# Patient Record
Sex: Male | Born: 1975 | Race: Black or African American | Hispanic: No | Marital: Single
Health system: Midwestern US, Community
[De-identification: ages and names within clinical notes are randomized; demographics above are authoritative.]

## PROBLEM LIST (undated history)

## (undated) DIAGNOSIS — I10 Essential (primary) hypertension: Secondary | ICD-10-CM

## (undated) DIAGNOSIS — R0602 Shortness of breath: Secondary | ICD-10-CM

## (undated) DIAGNOSIS — K649 Unspecified hemorrhoids: Secondary | ICD-10-CM

## (undated) DIAGNOSIS — K625 Hemorrhage of anus and rectum: Secondary | ICD-10-CM

---

## 2005-01-01 ENCOUNTER — Emergency Department: Payer: Self-pay | Admitting: Emergency Medicine

## 2005-01-12 ENCOUNTER — Emergency Department: Payer: Self-pay | Admitting: Emergency Medicine

## 2006-02-19 ENCOUNTER — Emergency Department: Payer: Self-pay | Admitting: Emergency Medicine

## 2006-10-04 ENCOUNTER — Emergency Department: Payer: Self-pay | Admitting: Emergency Medicine

## 2006-10-04 ENCOUNTER — Other Ambulatory Visit: Payer: Self-pay

## 2007-01-07 ENCOUNTER — Emergency Department: Payer: Self-pay | Admitting: Emergency Medicine

## 2007-01-08 ENCOUNTER — Other Ambulatory Visit: Payer: Self-pay

## 2009-06-20 ENCOUNTER — Emergency Department: Payer: Self-pay | Admitting: Emergency Medicine

## 2009-09-21 ENCOUNTER — Emergency Department: Payer: Self-pay

## 2010-09-15 ENCOUNTER — Inpatient Hospital Stay (HOSPITAL_COMMUNITY)
Admission: EM | Admit: 2010-09-15 | Discharge: 2010-09-17 | Payer: Self-pay | Source: Home / Self Care | Admitting: Emergency Medicine

## 2010-09-15 ENCOUNTER — Ambulatory Visit: Payer: Self-pay | Admitting: Orthopedic Surgery

## 2010-09-17 ENCOUNTER — Encounter: Payer: Self-pay | Admitting: Orthopedic Surgery

## 2010-09-23 ENCOUNTER — Ambulatory Visit: Payer: Self-pay | Admitting: Orthopedic Surgery

## 2010-09-23 DIAGNOSIS — S82843A Displaced bimalleolar fracture of unspecified lower leg, initial encounter for closed fracture: Secondary | ICD-10-CM

## 2010-09-23 DIAGNOSIS — S9306XA Dislocation of unspecified ankle joint, initial encounter: Secondary | ICD-10-CM | POA: Insufficient documentation

## 2010-09-30 ENCOUNTER — Ambulatory Visit: Payer: Self-pay | Admitting: Orthopedic Surgery

## 2010-10-01 ENCOUNTER — Encounter: Payer: Self-pay | Admitting: Orthopedic Surgery

## 2010-10-29 ENCOUNTER — Ambulatory Visit
Admission: RE | Admit: 2010-10-29 | Discharge: 2010-10-29 | Payer: Self-pay | Source: Home / Self Care | Attending: Orthopedic Surgery | Admitting: Orthopedic Surgery

## 2010-11-09 ENCOUNTER — Inpatient Hospital Stay (HOSPITAL_COMMUNITY)
Admission: EM | Admit: 2010-11-09 | Discharge: 2010-11-15 | DRG: 496 | Disposition: A | Discharge status: Home / Self Care | Attending: Orthopedic Surgery | Admitting: Orthopedic Surgery

## 2010-11-09 DIAGNOSIS — B958 Unspecified staphylococcus as the cause of diseases classified elsewhere: Secondary | ICD-10-CM | POA: Diagnosis present

## 2010-11-09 DIAGNOSIS — L02419 Cutaneous abscess of limb, unspecified: Secondary | ICD-10-CM | POA: Diagnosis present

## 2010-11-09 DIAGNOSIS — Y831 Surgical operation with implant of artificial internal device as the cause of abnormal reaction of the patient, or of later complication, without mention of misadventure at the time of the procedure: Secondary | ICD-10-CM | POA: Diagnosis present

## 2010-11-09 DIAGNOSIS — T847XXA Infection and inflammatory reaction due to other internal orthopedic prosthetic devices, implants and grafts, initial encounter: Principal | ICD-10-CM | POA: Diagnosis present

## 2010-11-09 DIAGNOSIS — I10 Essential (primary) hypertension: Secondary | ICD-10-CM | POA: Diagnosis present

## 2010-11-09 LAB — DIFFERENTIAL
Eosinophils Absolute: 0.2 10*3/uL (ref 0.0–0.7)
Eosinophils Relative: 1 % (ref 0–5)
Lymphs Abs: 1.1 10*3/uL (ref 0.7–4.0)
Monocytes Relative: 11 % (ref 3–12)
Neutrophils Relative %: 81 % — ABNORMAL HIGH (ref 43–77)

## 2010-11-09 LAB — CBC
MCH: 28.7 pg (ref 26.0–34.0)
MCV: 82.1 fL (ref 78.0–100.0)
Platelets: 249 10*3/uL (ref 150–400)
RBC: 4.46 MIL/uL (ref 4.22–5.81)

## 2010-11-09 LAB — BASIC METABOLIC PANEL
BUN: 12 mg/dL (ref 6–23)
Chloride: 97 mEq/L (ref 96–112)
Creatinine, Ser: 1.31 mg/dL (ref 0.4–1.5)
Glucose, Bld: 135 mg/dL — ABNORMAL HIGH (ref 70–99)

## 2010-11-10 LAB — DIFFERENTIAL
Eosinophils Relative: 4 % (ref 0–5)
Lymphocytes Relative: 15 % (ref 12–46)
Lymphs Abs: 1.4 10*3/uL (ref 0.7–4.0)
Monocytes Absolute: 1.5 10*3/uL — ABNORMAL HIGH (ref 0.1–1.0)

## 2010-11-10 LAB — CBC
HCT: 31.5 % — ABNORMAL LOW (ref 39.0–52.0)
Hemoglobin: 10.8 g/dL — ABNORMAL LOW (ref 13.0–17.0)
MCHC: 34.3 g/dL (ref 30.0–36.0)
MCV: 82.2 fL (ref 78.0–100.0)
RDW: 14.3 % (ref 11.5–15.5)

## 2010-11-10 LAB — BASIC METABOLIC PANEL
CO2: 27 mEq/L (ref 19–32)
Chloride: 99 mEq/L (ref 96–112)
Glucose, Bld: 128 mg/dL — ABNORMAL HIGH (ref 70–99)
Potassium: 3.4 mEq/L — ABNORMAL LOW (ref 3.5–5.1)
Sodium: 134 mEq/L — ABNORMAL LOW (ref 135–145)

## 2010-11-11 LAB — DIFFERENTIAL
Eosinophils Absolute: 0.5 10*3/uL (ref 0.0–0.7)
Eosinophils Relative: 6 % — ABNORMAL HIGH (ref 0–5)
Lymphocytes Relative: 19 % (ref 12–46)
Lymphs Abs: 1.8 10*3/uL (ref 0.7–4.0)
Monocytes Absolute: 1.2 10*3/uL — ABNORMAL HIGH (ref 0.1–1.0)

## 2010-11-11 LAB — CBC
HCT: 30.7 % — ABNORMAL LOW (ref 39.0–52.0)
MCH: 28.3 pg (ref 26.0–34.0)
MCHC: 33.9 g/dL (ref 30.0–36.0)
MCV: 83.4 fL (ref 78.0–100.0)
Platelets: 251 10*3/uL (ref 150–400)
RDW: 15 % (ref 11.5–15.5)
WBC: 9.5 10*3/uL (ref 4.0–10.5)

## 2010-11-11 LAB — BASIC METABOLIC PANEL
BUN: 9 mg/dL (ref 6–23)
CO2: 25 mEq/L (ref 19–32)
GFR calc non Af Amer: >60 mL/min (ref >60)
Glucose, Bld: 146 mg/dL — ABNORMAL HIGH (ref 70–99)
Potassium: 3.6 mEq/L (ref 3.5–5.1)

## 2010-11-11 NOTE — Op Note (Addendum)
  NAME:  Dennis Sparks, Dennis Sparks               ACCOUNT NO.:  [id number]  MEDICAL RECORD NO.:  [id number]          PATIENT TYPE:  INP  LOCATION:  A309                          FACILITY:  [facility]  PHYSICIAN:  Vickki Hearing, M.D.DATE OF BIRTH:  09-20-1976  DATE OF PROCEDURE:  11/10/2010 DATE OF DISCHARGE:                              OPERATIVE REPORT   A 35 year old male status post open treatment internal fixation left ankle fracture dislocation over 3 months ago was seen in the office, discharge from care who presented back to the emergency room on the 28th with draining purulence from his incision.  X-rays showed the hardware intact, fracture healed.  The patient was scheduled for incision and drainage and hardware removal.  PREOPERATIVE DIAGNOSIS:  Infection, left ankle.  POSTOP DIAGNOSIS:  Infection, left ankle.  PROCEDURE:  Irrigation and debridement, incision and drainage left ankle with hardware removal of the lateral plate and screws.  SURGEON:  Vickki Hearing, M.D.  ASSISTANT:  None.  ANESTHESIA:  General.  OPERATIVE FINDINGS:  Hardware was intact and fracture has healed.  There was extensive edema and serosanguineous fluid in the soft tissues anterior to the fibula, posterior to the fibula along the incision, proximal and distal to the fixation.  Proper site marking was performed.  Chart update was done.  Time-out procedure was executed at the appropriate time.  After general anesthetic, the patient's left foot and ankle was prepped and draped up to the knee, tourniquet was placed on his left thigh prior to this.  After time-out, limb was exsanguinated with a 4-inch Esmarch, tourniquet was elevated to 300 mmHg.  The previous incision was used and was taken.  An incision was made down to bone with a full-thickness flap.  There was no gross purulence but a lot of serosanguineous drainage and swelling of the soft tissues with necrotic tissue and fat necrosis.  The  fracture fixation was intact.  None of the screws were loose.  The fracture was healed.  The plate was removed along with the inner frag screw.  The wound was irrigated with 3 L of saline and then the wound was closed with the 0 Monocryl initially, packed with a 2-inch Kling, and skin sutures were made with #2-0 nylon, far-near, near-far fashion.  Dressings were applied, Ace wrap was applied.  The patient was extubated and taken to recovery room in stable condition.  I have ordered a PICC line for him.  He can start some Lovenox at 8 o'clock in the morning.  He can resume his blood pressure medications and he will need a dressing change under anesthesia, probably Tuesday or Wednesday.  I expect he will need 6 weeks of IV antibiotics.     Vickki Hearing, M.D.     SEH/MEDQ  D:  11/10/2010  T:  11/11/2010  Job:  [id number]  Electronically Signed by Fuller Canada M.D. on 11/11/2010 04:44:00 PM

## 2010-11-11 NOTE — Discharge Summary (Addendum)
Dennis Sparks, Dennis Sparks               ACCOUNT NO.:  [id number]  MEDICAL RECORD NO.:  [id number]          PATIENT TYPE:  INP  LOCATION:  A309                          FACILITY:  [facility]  PHYSICIAN:  Vickki Hearing, M.D.DATE OF BIRTH:  08-Aug-1976  DATE OF ADMISSION:  11/09/2010 DATE OF DISCHARGE:  LH                         DISCHARGE SUMMARY-REFERRING   CHIEF COMPLAINT:  Drainage, left ankle.  HISTORY:  This is a 35 year old male prisoner who was treated for a fracture dislocation of the left ankle with open treatment and internal fixation with a Synthes small fragment plate and screws and medial screw and pin back on September 17, 2010.  He was recently discharged from the office and placed in an air cast and returned to the prison.  He was doing well until this Tuesday when he started having increased pain and swelling, which eventually resulted in drainage from his left ankle presented to the emergency room today.  The temperature 99.5 and purulent drainage from the inferior portion of the left ankle. Radiographs were obtained at that time showed no change of position of the hardware.  There were no radiolucencies.  His most recent x-ray in the office showed that his fracture had healed.  MEDICAL HISTORY:  Has no known drug allergies.  He did take some hydrochlorothiazide for hypertension.  REVIEW OF SYSTEMS:  Negative.  He says that the pain has decreased once the drainage started.  Prior to Tuesday and up until Tuesday, he was ambulating fine with no pain or loss of function.  Currently, has a pulse of 111, respiratory rate of 16, blood pressure 108/69.  He is 98% on room air.  His temperature is 99.5.  His appearance was normal.  He had normal grooming and hygiene.  He had a normal pulse and perfusion to the left lower extremity with swelling up to the lower third of the ankle.  There was no tenderness on the medial side.  The medial incision was healed with no  complications.  The lateral incision was healed as well with a pinpoint area drainage from the inferior portion.  He was awake, alert and oriented x3.  Mood and affect were normal.  He had normal sensation in the left foot.  His upper extremities were normal.  He had swelling again up to the lower third of the tibia area in the soft tissues including the lateral ankle, mild swelling in the foot.  There was palpable expressible purulence from the superior portion of the wound down to the inferior portion of the wound where the area was open.  Open areas less than a centimeter in length and is probably 2-3 mm long.  Ankle joint is painless on range of motion.  Radiographs show healed left ankle fracture with the lateral plate screw, one in a Frag screw and on the medial side is a malleolar screw with a K-wire for rotation.  IMPRESSION:  Left ankle infection, status post open treatment internal fixation.  PLAN:  Incision drainage hardware removal.  I have discussed the procedure with the patient, treatment options and he agrees with the treatment option.  Informed consent has  been completed.  On Sunday, we will irrigate, debride and remove his hardware and probably legal portion of the wound open and packed.     Vickki Hearing, M.D.     SEH/MEDQ  D:  11/09/2010  T:  11/09/2010  Job:  [id number]  Electronically Signed by Fuller Canada M.D. on 11/11/2010 04:43:56 PM

## 2010-11-12 LAB — CBC
Hemoglobin: 11.5 g/dL — ABNORMAL LOW (ref 13.0–17.0)
MCH: 28.4 pg (ref 26.0–34.0)
MCHC: 34.4 g/dL (ref 30.0–36.0)
RDW: 15 % (ref 11.5–15.5)

## 2010-11-12 LAB — VANCOMYCIN, TROUGH: Vancomycin Tr: 35.1 ug/mL (ref 10.0–20.0)

## 2010-11-12 LAB — BASIC METABOLIC PANEL
BUN: 10 mg/dL (ref 6–23)
CO2: 28 mEq/L (ref 19–32)
Calcium: 9.5 mg/dL (ref 8.4–10.5)
Creatinine, Ser: 1.14 mg/dL (ref 0.4–1.5)
GFR calc non Af Amer: >60 mL/min (ref >60)
Glucose, Bld: 96 mg/dL (ref 70–99)

## 2010-11-12 LAB — WOUND CULTURE

## 2010-11-12 LAB — DIFFERENTIAL
Basophils Absolute: 0.1 10*3/uL (ref 0.0–0.1)
Eosinophils Absolute: 0.5 10*3/uL (ref 0.0–0.7)
Eosinophils Relative: 6 % — ABNORMAL HIGH (ref 0–5)
Lymphocytes Relative: 25 % (ref 12–46)
Monocytes Absolute: 1.2 10*3/uL — ABNORMAL HIGH (ref 0.1–1.0)

## 2010-11-12 NOTE — Letter (Signed)
Summary: Patient medication discharge instructions  Patient medication discharge instructions   Imported By: Jacklynn Ganong 09/20/2010 09:03:30  _____________________________________________________________________  External Attachment:    Type:   Image     Comment:   External Document

## 2010-11-13 LAB — CBC
MCH: 28.7 pg (ref 26.0–34.0)
MCHC: 34.7 g/dL (ref 30.0–36.0)
Platelets: 303 10*3/uL (ref 150–400)

## 2010-11-13 LAB — WOUND CULTURE: Gram Stain: NONE SEEN

## 2010-11-13 LAB — DIFFERENTIAL
Basophils Absolute: 0.1 10*3/uL (ref 0.0–0.1)
Basophils Relative: 1 % (ref 0–1)
Eosinophils Absolute: 0.4 10*3/uL (ref 0.0–0.7)
Monocytes Absolute: 1 10*3/uL (ref 0.1–1.0)
Monocytes Relative: 12 % (ref 3–12)
Neutrophils Relative %: 60 % (ref 43–77)

## 2010-11-13 LAB — BASIC METABOLIC PANEL
BUN: 10 mg/dL (ref 6–23)
CO2: 29 mEq/L (ref 19–32)
Calcium: 8.8 mg/dL (ref 8.4–10.5)
Chloride: 102 mEq/L (ref 96–112)
Creatinine, Ser: 1.01 mg/dL (ref 0.4–1.5)
GFR calc Af Amer: >60 mL/min (ref >60)

## 2010-11-14 LAB — DIFFERENTIAL
Basophils Absolute: 0.1 10*3/uL (ref 0.0–0.1)
Eosinophils Absolute: 0.4 10*3/uL (ref 0.0–0.7)
Eosinophils Relative: 5 % (ref 0–5)
Lymphocytes Relative: 25 % (ref 12–46)

## 2010-11-14 LAB — CBC
Hemoglobin: 10.3 g/dL — ABNORMAL LOW (ref 13.0–17.0)
MCHC: 33.8 g/dL (ref 30.0–36.0)
RDW: 15.1 % (ref 11.5–15.5)

## 2010-11-14 LAB — BASIC METABOLIC PANEL
Calcium: 8.6 mg/dL (ref 8.4–10.5)
Creatinine, Ser: 1.14 mg/dL (ref 0.4–1.5)
GFR calc Af Amer: >60 mL/min (ref >60)
GFR calc non Af Amer: >60 mL/min (ref >60)
Sodium: 139 mEq/L (ref 135–145)

## 2010-11-14 LAB — CULTURE, BLOOD (ROUTINE X 2): Culture: NO GROWTH

## 2010-11-14 LAB — VANCOMYCIN, RANDOM: Vancomycin Rm: <5 ug/mL

## 2010-11-14 NOTE — Assessment & Plan Note (Signed)
Summary: 4 WK RE-CK/XRAY LT ANKLE OOP/POST OP 09/15/10/[facility]   Visit Type:  Follow-up  CC:  left ankle fracture.  History of Present Illness: 35 year old male here for a postop visit after LEFT ankle fracture  surgical procedure open treatment internal fixation LEFT ankle with Synthes small fragment set  Date of surgery September 16, 2010  This is postoperative week number 6   WB STATUS: CRUTCHES NON WEIGHT BEARING   Current medication Norco 10 mg one q. 4 p.r.n. for pain and ibuprofen 800 mg q.8 hours. Lisinopril/Hydrochlorothiazide 20/25 mg 1 daily.  Today for xrays OOP.  Complaints: Good.  He continues to improve. His ankle. Incisions look good. His foot looks good. His foot is plantigrade.  Separately identifiable. X-ray report 3 views LEFT ankle. There is a pin and screw on the medial side and a plate and screw construct with interfragmentary screw. Fixation laterally. The ankle mortise has been reduced. The fracture is healing well.  Impression healing ankle fracture, LEFT with internal fixation.     Impression & Recommendations:  Problem # 1:  CLOSED DISLOCATION OF ANKLE (ICD-837.0)  Orders: Post-Op Check (62694) Ankle x-ray complete,  minimum 3 views (85462)  Problem # 2:  CLOSED BIMALLEOLAR FRACTURE (ICD-824.4)  Orders: Post-Op Check (70350) Ankle x-ray complete,  minimum 3 views (09381)  Problem # 3:  AFTERCARE FOLLOWING SURGERY INJURY AND TRAUMA (ICD-V58.43) Assessment: Improved  Orders: Post-Op Check (82993) Ankle x-ray complete,  minimum 3 views (71696)  Patient Instructions: 1)  CONVERT TO AIR CAST X 6 WEEKS THEN REPEAT XRAYS 2)  WEIGHT BEAR AS TOLERATED    Orders Added: 1)  Post-Op Check [99024] 2)  Ankle x-ray complete,  minimum 3 views [78938]

## 2010-11-14 NOTE — Assessment & Plan Note (Signed)
Summary: POST OP 1/OTIF/LT ANKLE FX/PER DR HARRISON/[facility]   Visit Type:  post op  CC:  left ankle fracture.  History of Present Illness: I saw Dennis Sparks in the office today for a followup visit.  He is a 35 years old man with the complaint of:  post op #1, left ankle  DOS 09-16-10. OTIF, left ankle with Synthes small fragment set.  Medications: Norco 10/325 mg 1 q as needed , Ibuprofen 800 mg q 8 hrs , Lisinopril/Hydrochlorothiazide 20/25 mg 1 daily.  Nonweightbearing on crutches.  He c/o constant pain and swelling     Physical Exam  Additional Exam:  The wounds looks good  There is mild swelling of the foot    Impression & Recommendations:  Problem # 1:  CLOSED BIMALLEOLAR FRACTURE (ICD-824.4) Assessment Comment Only  Orders: Post-Op Check (04540) Short Leg Splint (98119)  Problem # 2:  CLOSED DISLOCATION OF ANKLE (ICD-837.0) Assessment: Comment Only  post op stable   Orders: Post-Op Check (14782)  Patient Instructions: 1)  return 09/30/2010 2)  staples out and xrays  3)  then cast   Orders Added: 1)  Post-Op Check [99024] 2)  Short Leg Splint [29515]

## 2010-11-14 NOTE — Op Note (Signed)
  NAME:  Dennis Sparks, Dennis Sparks               ACCOUNT NO.:  [id number]  MEDICAL RECORD NO.:  [id number]          PATIENT TYPE:  INP  LOCATION:  A309                          FACILITY:  [facility]  PHYSICIAN:  Vickki Hearing, M.D.DATE OF BIRTH:  08-16-1976  DATE OF PROCEDURE:  11/13/2010 DATE OF DISCHARGE:                              OPERATIVE REPORT   This is a 35 year old male who had a open treatment internal fixation of the left ankle, did well over 3 months time, was released from care, presented back to the hospital with draining left ankle wound.  He underwent removal of his internal fixation, irrigation and debridement, he comes back today for dressing change under anesthesia.  PREOPERATIVE DIAGNOSIS:  Infection, left ankle.  POSTOPERATIVE DIAGNOSIS:  Infection, left ankle.  PROCEDURE:  Irrigation and dressing change, left ankle.  SURGEON:  Vickki Hearing, MD  ASSISTANTS:  None.  ANESTHESIA:  General.  OPERATIVE FINDINGS:  Clean wound.  No signs of purulent material. Bleeding muscle reacted well to cautery stimulation.  Sutures were removed.  Dressing was removed.  Wound was irrigated with saline, then closed with 0 Monocryl and #2 nylon suture.  The distal portion of the wound was left open and a packing was placed in the wound.  Time-out procedure was completed prior to the prep and drape.  Sterile dressings were applied.  The patient was extubated and taken to recovery room in stable condition.  POSTOP PLAN:  Continue vancomycin.  Cultures have already been taken. Staph aureus oxacillin sensitive was the ID of the bacteria.     Vickki Hearing, M.D.     SEH/MEDQ  D:  11/13/2010  T:  11/14/2010  Job:  [id number]  Electronically Signed by Fuller Canada M.D. on 11/14/2010 11:03:53 AM

## 2010-11-14 NOTE — Assessment & Plan Note (Signed)
Summary: RE-CK LT ANKLE/REM STAPLES+XRAY/POST OP 09/15/10/[facility]   Visit Type:  Follow-up  CC:  post op left ankle.  History of Present Illness: 35 years old male status post open treatment internal fixation LEFT ankle with Synthes small fragment set  Date of surgery September 16, 2010  Current medication Norco 10 mg one q. 4 p.r.n. for pain and ibuprofen 800 mg q.8 hours. Lisinopril/Hydrochlorothiazide 20/25 mg 1 daily.  Today for staple removal, xrays and then cast.  Doing well, no pain, just soreness and stiffness.     Impression & Recommendations:  Problem # 1:  CLOSED DISLOCATION OF ANKLE (ICD-837.0) Assessment Improved  Orders: Post-Op Check (04540)  Problem # 2:  CLOSED BIMALLEOLAR FRACTURE (ICD-824.4) Assessment: Improved  x-rays 3 views LEFT ankle  Lateral plate with interfragmentary screw and medial screw with one pin.  Fracture line not visible.  Mortise intact.  Impression stable internal fixation LEFT ankle  Patient placed in a short leg nonweightbearing cast to return in 4 weeks for repeat films out of plaster  Orders: Post-Op Check (98119) Ankle x-ray complete,  minimum 3 views (14782)  Patient Instructions: 1)  Please schedule a follow-up appointment in 4 weeks. 2)  xrays in 4 wks left ankle ; OOP   Orders Added: 1)  Post-Op Check [99024] 2)  Ankle x-ray complete,  minimum 3 views [73610]

## 2010-11-14 NOTE — Letter (Signed)
Summary: Doctor Order  Doctor Order   Imported By: Jacklynn Ganong 10/01/2010 15:31:52  _____________________________________________________________________  External Attachment:    Type:   Image     Comment:   External Document

## 2010-11-15 ENCOUNTER — Telehealth: Payer: Self-pay | Admitting: Orthopedic Surgery

## 2010-11-15 LAB — ANAEROBIC CULTURE

## 2010-11-15 LAB — CBC
Hemoglobin: 10.7 g/dL — ABNORMAL LOW (ref 13.0–17.0)
MCHC: 33.2 g/dL (ref 30.0–36.0)
RBC: 3.81 MIL/uL — ABNORMAL LOW (ref 4.22–5.81)

## 2010-11-15 LAB — BASIC METABOLIC PANEL
Calcium: 9.4 mg/dL (ref 8.4–10.5)
Creatinine, Ser: 1.19 mg/dL (ref 0.4–1.5)
GFR calc Af Amer: >60 mL/min (ref >60)

## 2010-11-15 LAB — DIFFERENTIAL
Basophils Absolute: 0.1 10*3/uL (ref 0.0–0.1)
Basophils Relative: 1 % (ref 0–1)
Neutro Abs: 5.4 10*3/uL (ref 1.7–7.7)
Neutrophils Relative %: 60 % (ref 43–77)

## 2010-11-21 ENCOUNTER — Ambulatory Visit (INDEPENDENT_AMBULATORY_CARE_PROVIDER_SITE_OTHER): Admitting: Orthopedic Surgery

## 2010-11-21 ENCOUNTER — Encounter: Payer: Self-pay | Admitting: Orthopedic Surgery

## 2010-11-21 DIAGNOSIS — S82843A Displaced bimalleolar fracture of unspecified lower leg, initial encounter for closed fracture: Secondary | ICD-10-CM

## 2010-11-21 DIAGNOSIS — S9306XA Dislocation of unspecified ankle joint, initial encounter: Secondary | ICD-10-CM

## 2010-11-21 DIAGNOSIS — T847XXA Infection and inflammatory reaction due to other internal orthopedic prosthetic devices, implants and grafts, initial encounter: Secondary | ICD-10-CM

## 2010-11-21 DIAGNOSIS — Z4889 Encounter for other specified surgical aftercare: Secondary | ICD-10-CM

## 2010-11-28 NOTE — Assessment & Plan Note (Signed)
Summary: HOSP FOL/UP/POST OP/LT ANKLE SURG 11/10/10/[facility]...   Visit Type:  Follow-up  CC:  post op 1 ankle.  History of Present Illness: I saw Dennis Sparks in the office today for a followup visit.  He is a 35 years old man with the complaint of:  Post op 1 Irrigation DEBRIDMNET and dressing change left ankle.  DOS    11/11/10 Irrigation and Debridement, Incision and drainage with hardware removal of lateral plate and screws and   on 11/13/10 had Irrigation and dressing change.  THIS IS POD 8.  Norco 5 for pain does not help , No Ibuprofen taken. Has been given Vancomycin by PICC line 1250mg  q 12hrs.  HIS COMPLAINTS ARE: DRAINAGE AND  numbness nad burning at the incision.   Today is here for 1st post op dressing change    Pain level is 7 today.  Operative cultures showed staph aureus sensitive to clindamycin, erythromycin, gentamicin, Levaquin, oxacillin.  Penicillin resistant. Sensitive to vancomycin as well as Bactrim.  Sensitive to tetracycline as well as moxifloxacin.  Physical Exam  Additional Exam:   LEFT ankle wound. There proximal sutures in the proximal three fourths of the incision. There is a small shallow 3 x 5 mm depth of approximately 3-4 mm over the   Impression & Recommendations:  Problem # 1:  AFTERCARE FOLLOWING SURGERY INJURY AND TRAUMA (ICD-V58.43) Assessment Comment Only  Orders: Post-Op Check (21308)  Problem # 2:  CLOSED DISLOCATION OF ANKLE (ICD-837.0) Assessment: Comment Only  Orders: Post-Op Check (65784)  Problem # 3:  CLOSED BIMALLEOLAR FRACTURE (ICD-824.4) Assessment: Comment Only  Orders: Post-Op Check (69629)  Problem # 4:  INF&INFLAM-OTH INTRL ORTH DEVICE IMPLANT&GRAFT (BMW-413.24) Assessment: New  Orders: Post-Op Check (40102)  Medications Added to Medication List This Visit: 1)  Norco 7.5-325 Mg Tabs (Hydrocodone-acetaminophen) .Marland Kitchen.. 1 q 4 hrs as needed pain 2)  Ibuprofen 800 Mg Tabs (Ibuprofen) .Marland Kitchen.. 1 by  mouth three times a day  Patient Instructions: 1)  increase pain medication to hydrocodone 7.5 add ibuprofen  2)  continue antibiotics until March 10th  3)  follow up in 2 weeks  4)  facility nurse remove sutures in 5 days on Feb. 14 Prescriptions: IBUPROFEN 800 MG TABS (IBUPROFEN) 1 by mouth three times a day  #90 x 5   Entered and Authorized by:   Fuller Canada MD   Signed by:   Fuller Canada MD on 11/21/2010   Method used:   Print then Give to Patient   RxID:   [id number] NORCO 7.5-325 MG TABS (HYDROCODONE-ACETAMINOPHEN) 1 q 4 hrs as needed pain  #84 x 5   Entered and Authorized by:   Fuller Canada MD   Signed by:   Fuller Canada MD on 11/21/2010   Method used:   Print then Give to Patient   RxID:   [id number]    Orders Added: 1)  Post-Op Check [41660]

## 2010-11-28 NOTE — Progress Notes (Signed)
Summary: found a facility for Dennis Sparks  Phone Note Other Incoming   Summary of Call: Dennis Sparks [phone]) wanted you to know that they have found a facility to take Dennis Sparks.  He will be going to [facility], the number there is  [phone].  Dennis Sparks said to call her if there is any questions. Initial call taken by: Dennis Sparks,  November 15, 2010 9:09 AM

## 2010-11-30 NOTE — Discharge Summary (Signed)
  NAMEKELVEN, Dennis Sparks               ACCOUNT NO.:  [id number]  MEDICAL RECORD NO.:  [id number]          PATIENT TYPE:  INP  LOCATION:  A309                          FACILITY:  [facility]  PHYSICIAN:  Vickki Hearing, M.D.DATE OF BIRTH:  Dec 26, 1975  DATE OF ADMISSION:  11/09/2010 DATE OF DISCHARGE:  02/03/2012LH                         DISCHARGE SUMMARY-REFERRING   ADMITTING DIAGNOSIS:  Infection left ankle.  DISCHARGE DIAGNOSIS:  Infection left ankle.  OPERATION: 1. On November 11, 2010, he underwent irrigation debridement, incision     and drainage left ankle and hardware removal of lateral plate and     screws. 2. On November 14, 2010, he underwent irrigation and dressing change,     left ankle.  SURGEON:  Vickki Hearing, MD  OPERATIVE FINDINGS:  There was significant purulent material in the first operation with intact hardware and fracture healing and the fibular plating and screws were removed.  There appeared to be no bone infection.  On the second procedure, the wound was clean.  There was no sign of purulent material and the muscle was bleeding well with good reaction to cautery.  The patient was started on IV vancomycin and has been on antibiotics now for 6 days and will need 36 additional days of IV vancomycin.  Operative cultures showed staph aureus sensitive to clindamycin, erythromycin, gentamicin, Levaquin, oxacillin.  Penicillin resistant. Sensitive to vancomycin as well as Bactrim.  Sensitive to tetracycline as well as moxifloxacin.  Blood cultures were negative.  Temperature is 98, pulse was 102 varies between 87 and 102, respirations 20, blood pressure 120s/80s at times 100/65, currently 96/61, O2 sat is 97% on room air.  He is currently receiving wet-to-dry dressings.  He is on IV vancomycin again for 36 additional days.  He will need a vancomycin trough once a week and a creatinine level once a week.  These levels can be reported to [phone]  phone number and [phone] fax number.  Follow up appointment will need to be November 21, 2010.  Call [phone], make appointment for that day in the morning.  DISCHARGE INSTRUCTIONS:  No weightbearing on his left leg.  MEDICATIONS: 1. Hydrocodone 5 mg 1-2 tablets q.4 h. p.r.n. pain. 2. His current vancomycin dosage is 1250 mg IV q.24 h. 3. He is on lisinopril 20 mg daily with Prinivil. 4. He is on hydrochlorothiazide 25 mg daily. 5. He is on ibuprofen 800 mg q.8 h. p.r.n. pain, swelling.  DISCHARGE DISPOSITION:  The facility of choice per the prison nurse, Dennis Sparks.  ALLERGIES:  He has no known drug allergies.     Vickki Hearing, M.D.     SEH/MEDQ  D:  11/15/2010  T:  11/15/2010  Job:  [id number]  Electronically Signed by Fuller Canada M.D. on 11/30/2010 10:43:33 AM

## 2010-12-05 ENCOUNTER — Ambulatory Visit (INDEPENDENT_AMBULATORY_CARE_PROVIDER_SITE_OTHER): Admitting: Orthopedic Surgery

## 2010-12-05 ENCOUNTER — Encounter: Payer: Self-pay | Admitting: Orthopedic Surgery

## 2010-12-05 DIAGNOSIS — S9306XA Dislocation of unspecified ankle joint, initial encounter: Secondary | ICD-10-CM

## 2010-12-05 DIAGNOSIS — Z4889 Encounter for other specified surgical aftercare: Secondary | ICD-10-CM

## 2010-12-05 DIAGNOSIS — S82843A Displaced bimalleolar fracture of unspecified lower leg, initial encounter for closed fracture: Secondary | ICD-10-CM

## 2010-12-05 DIAGNOSIS — T847XXA Infection and inflammatory reaction due to other internal orthopedic prosthetic devices, implants and grafts, initial encounter: Secondary | ICD-10-CM

## 2010-12-10 ENCOUNTER — Ambulatory Visit: Payer: Self-pay | Admitting: Orthopedic Surgery

## 2010-12-10 NOTE — Medication Information (Signed)
Summary: Tax adviser   Imported By: Cammie Sickle 12/06/2010 13:55:32  _____________________________________________________________________  External Attachment:    Type:   Image     Comment:   External Document

## 2010-12-10 NOTE — Assessment & Plan Note (Signed)
Summary: 2 WK RE-CK/POST OP RT ANKLE/SURG 11/10/10/Facilty [phone]...   Visit Type:  Follow-up  CC:  post op ankle.  History of Present Illness: I saw Dennis Sparks in the office today for a followup visit.  He is a 35 years old man with the complaint of:  Post op 2 Irrigation DEBRIDMNET and dressing change left ankle.  11/11/10 Irrigation and Debridement, Incision and drainage with hardware removal of lateral plate and screws and   on 11/13/10 had Irrigation and dressing change.  THIS IS POD 24.  Norco 7.5 for pain and Ibuprofen 800mg , does not need pain med anymore, does not help.   Pain level is 0 today without movement, only pain is if he gets area "bumped" or alot of movement of the leg.  Has been itching for 4 days, Benadryl does not help with the iching.  Has been given Vancomycin by PICC line 1250mg  q 12hrs, last day March 10th.  Today is 2 week recheck wound.  Has been having problems at facility that he is at, [[facility]] nurse there has forgot to turn on valve for treatments twice,     Physical Exam  Additional Exam:  he has a linear wound, approximately 6 cm with a depth of about 5 mm. Granulation tissue noted. Range of motion 0-20.     Impression & Recommendations:  Problem # 1:  INF&INFLAM-OTH INTRL ORTH DEVICE IMPLANT&GRAFT (UJW-119.14) Assessment Improved  Orders: Post-Op Check (78295)  Problem # 2:  AFTERCARE FOLLOWING SURGERY INJURY AND TRAUMA (ICD-V58.43) Assessment: Improved  advised that the milligrams of Benadryl prior to vancomycin. 10 mg, prednisone, prior to vancomycin.  Full weightbearing as tolerated and air cast  Orders: Post-Op Check (62130)  Patient Instructions: 1)  MARCH 13 TH F/U  2)  CONTINUE THE VANCOMYCIN  3)  ok to walk in the brace full weight bearing    Orders Added: 1)  Post-Op Check [86578]

## 2010-12-11 ENCOUNTER — Ambulatory Visit: Admitting: Orthopedic Surgery

## 2010-12-17 ENCOUNTER — Encounter (INDEPENDENT_AMBULATORY_CARE_PROVIDER_SITE_OTHER): Payer: Self-pay | Admitting: *Deleted

## 2010-12-17 ENCOUNTER — Telehealth: Payer: Self-pay | Admitting: Orthopedic Surgery

## 2010-12-19 ENCOUNTER — Telehealth: Payer: Self-pay | Admitting: Orthopedic Surgery

## 2010-12-19 NOTE — Letter (Signed)
Summary: Auth appt [facility]  Auth appt [facility]   Imported By: Cammie Sickle 12/09/2010 10:21:44  _____________________________________________________________________  External Attachment:    Type:   Image     Comment:   External Document

## 2010-12-24 ENCOUNTER — Ambulatory Visit: Admitting: Orthopedic Surgery

## 2010-12-24 LAB — DIFFERENTIAL
Eosinophils Absolute: 0 10*3/uL (ref 0.0–0.7)
Lymphocytes Relative: 17 % (ref 12–46)
Lymphs Abs: 1.7 10*3/uL (ref 0.7–4.0)
Monocytes Relative: 7 % (ref 3–12)
Neutrophils Relative %: 76 % (ref 43–77)

## 2010-12-24 LAB — BASIC METABOLIC PANEL
CO2: 27 mEq/L (ref 19–32)
Calcium: 8.8 mg/dL (ref 8.4–10.5)
Chloride: 100 mEq/L (ref 96–112)
GFR calc Af Amer: >60 mL/min (ref >60)
Sodium: 140 mEq/L (ref 135–145)

## 2010-12-24 LAB — CBC
Hemoglobin: 13.8 g/dL (ref 13.0–17.0)
MCH: 29.1 pg (ref 26.0–34.0)
RBC: 4.75 MIL/uL (ref 4.22–5.81)
WBC: 10.1 10*3/uL (ref 4.0–10.5)

## 2010-12-24 LAB — PROTIME-INR: INR: 0.96 (ref 0.00–1.49)

## 2010-12-24 NOTE — Miscellaneous (Signed)
  Clinical Lists Changes 

## 2010-12-24 NOTE — Progress Notes (Signed)
Summary: refused last 7 days of Vancomycin  Phone Note Other Incoming   Summary of Call: Delaney Meigs Misenheimer/RN at [facility] called about Dennis Sparks ([birth year]) Wanted you to be aware that Dennis Sparks refused the last 7 days of his IV Vancomycin.  He was started on Septra by mouth yesterday (12/16/10) His next appointment here is 12/24/10.  Delaney Meigs can be reached at [phone] if you  need to speak with her about this or have any questions. Initial call taken by: Jacklynn Ganong,  December 17, 2010 8:42 AM

## 2010-12-30 ENCOUNTER — Encounter: Payer: Self-pay | Admitting: Gastroenterology

## 2010-12-31 NOTE — Progress Notes (Signed)
Summary: facility representative re-scheduled appointment  Phone Note Other Incoming   Caller: [facility] Summary of Call: Lennox Solders from [facility] facility, ph [phone], called earlier today (10:45am) to relay that they will need to re-schedule patient Dennis Sparks' appt for 12/24/10 due to transportation (shortage of transportation staff).  Re-scheduled for first available appt. Initial call taken by: Cammie Sickle,  December 19, 2010 7:15 PM

## 2011-01-09 ENCOUNTER — Ambulatory Visit (INDEPENDENT_AMBULATORY_CARE_PROVIDER_SITE_OTHER): Admitting: Orthopedic Surgery

## 2011-01-09 DIAGNOSIS — S82843A Displaced bimalleolar fracture of unspecified lower leg, initial encounter for closed fracture: Secondary | ICD-10-CM

## 2011-01-09 MED ORDER — IBUPROFEN 800 MG PO TABS: 800.0000 mg | ORAL_TABLET | Freq: Three times a day (TID) | ORAL | Status: AC | PRN

## 2011-01-09 NOTE — Medication Information (Signed)
Summary: RX Folder  RX Folder   Imported By: Hendricks Limes LPN 81/19/1478 29:56:21  _____________________________________________________________________  External Attachment:    Type:   Image     Comment:   External Document

## 2011-01-09 NOTE — Letter (Signed)
Summary: [facility] Authorizations visits 11/21/10 12/05/10  [facility] Authorizations visits 11/21/10 12/05/10   Imported By: Cammie Sickle 12/30/2010 20:12:31  _____________________________________________________________________  External Attachment:    Type:   Image     Comment:   External Document

## 2011-01-09 NOTE — Progress Notes (Signed)
He says he is doing well  Exam plating with an Aircast minimal pain.  He had some pain over his great toe metatarsophalangeal joint and he has a mild bunion deformity although he says it wasn't like that before he fell.  Don't see any real problem he does have a flexible bunion deformity with some prominence of the metatarsal head  His ankle incision has healed.  He has some swelling which is somewhat caused by the way he is applying his Ace wraps so I gave him a new way to do that  Things have healed he does not need a new appointment after today

## 2011-03-06 ENCOUNTER — Emergency Department: Payer: Self-pay | Admitting: Emergency Medicine

## 2011-03-11 ENCOUNTER — Emergency Department: Payer: Self-pay | Admitting: Emergency Medicine

## 2011-05-08 ENCOUNTER — Emergency Department: Payer: Self-pay

## 2014-10-21 ENCOUNTER — Emergency Department: Payer: Self-pay | Admitting: Emergency Medicine

## 2014-10-21 LAB — CBC
HCT: 40.4 % (ref 40.0–52.0)
HGB: 13.5 g/dL (ref 13.0–18.0)
MCH: 30.7 pg (ref 26.0–34.0)
MCHC: 33.5 g/dL (ref 32.0–36.0)
MCV: 92 fL (ref 80–100)
Platelet: 207 10*3/uL (ref 150–440)
RBC: 4.41 10*6/uL (ref 4.40–5.90)
RDW: 14 % (ref 11.5–14.5)
WBC: 6.3 10*3/uL (ref 3.8–10.6)

## 2014-10-21 LAB — URINALYSIS, COMPLETE
Bacteria: NONE SEEN
Bilirubin,UR: NEGATIVE
Blood: NEGATIVE
Glucose,UR: NEGATIVE mg/dL (ref 0–75)
KETONE: NEGATIVE
LEUKOCYTE ESTERASE: NEGATIVE
NITRITE: NEGATIVE
PH: 7 (ref 4.5–8.0)
PROTEIN: NEGATIVE
Specific Gravity: 1.013 (ref 1.003–1.030)
Squamous Epithelial: 1

## 2014-10-21 LAB — COMPREHENSIVE METABOLIC PANEL
ALBUMIN: 3.6 g/dL (ref 3.4–5.0)
ANION GAP: 8 (ref 7–16)
Alkaline Phosphatase: 60 U/L
BUN: 8 mg/dL (ref 7–18)
Bilirubin,Total: 0.6 mg/dL (ref 0.2–1.0)
CO2: 26 mmol/L (ref 21–32)
CREATININE: 0.97 mg/dL (ref 0.60–1.30)
Calcium, Total: 8.4 mg/dL — ABNORMAL LOW (ref 8.5–10.1)
Chloride: 105 mmol/L (ref 98–107)
EGFR (African American): >60
Glucose: 96 mg/dL (ref 65–99)
OSMOLALITY: 276 (ref 275–301)
POTASSIUM: 3.3 mmol/L — AB (ref 3.5–5.1)
SGOT(AST): 26 U/L (ref 15–37)
SGPT (ALT): 20 U/L
SODIUM: 139 mmol/L (ref 136–145)
Total Protein: 7 g/dL (ref 6.4–8.2)

## 2014-10-21 LAB — TROPONIN I: TROPONIN-I: 0.03 ng/mL

## 2014-10-21 LAB — LIPASE, BLOOD: LIPASE: 225 U/L (ref 73–393)

## 2016-03-05 ENCOUNTER — Inpatient Hospital Stay
Admit: 2016-03-05 | Discharge: 2016-03-05 | Disposition: A | Discharge status: Home / Self Care | Payer: MEDICAID | Attending: Emergency Medicine

## 2016-03-05 ENCOUNTER — Emergency Department: Admit: 2016-03-05 | Payer: MEDICAID | Primary: Nurse Practitioner

## 2016-03-05 DIAGNOSIS — N23 Unspecified renal colic: Secondary | ICD-10-CM

## 2016-03-05 LAB — CBC WITH AUTOMATED DIFF
ABS. BASOPHILS: 0 10*3/uL (ref 0.0–0.06)
ABS. EOSINOPHILS: 0.1 10*3/uL (ref 0.0–0.4)
ABS. LYMPHOCYTES: 2 10*3/uL (ref 0.9–3.6)
ABS. MONOCYTES: 0.4 10*3/uL (ref 0.05–1.2)
ABS. NEUTROPHILS: 2.3 10*3/uL (ref 1.8–8.0)
BASOPHILS: 0 % (ref 0–2)
EOSINOPHILS: 2 % (ref 0–5)
HCT: 42.5 % (ref 36.0–48.0)
HGB: 14.5 g/dL (ref 13.0–16.0)
LYMPHOCYTES: 42 % (ref 21–52)
MCH: 30.6 PG (ref 24.0–34.0)
MCHC: 34.1 g/dL (ref 31.0–37.0)
MCV: 89.7 FL (ref 74.0–97.0)
MONOCYTES: 9 % (ref 3–10)
MPV: 10.5 FL (ref 9.2–11.8)
NEUTROPHILS: 47 % (ref 40–73)
PLATELET: 230 10*3/uL (ref 135–420)
RBC: 4.74 M/uL (ref 4.70–5.50)
RDW: 13.3 % (ref 11.6–14.5)
WBC: 4.9 10*3/uL (ref 4.6–13.2)

## 2016-03-05 LAB — METABOLIC PANEL, BASIC
Anion gap: 9 mmol/L (ref 3.0–18)
BUN/Creatinine ratio: 9 — ABNORMAL LOW (ref 12–20)
BUN: 10 MG/DL (ref 7.0–18)
CO2: 24 mmol/L (ref 21–32)
Calcium: 8.8 MG/DL (ref 8.5–10.1)
Chloride: 106 mmol/L (ref 100–108)
Creatinine: 1.12 MG/DL (ref 0.6–1.3)
GFR est AA: >60 mL/min/{1.73_m2} (ref >60)
GFR est non-AA: >60 mL/min/{1.73_m2} (ref >60)
Glucose: 138 mg/dL — ABNORMAL HIGH (ref 74–99)
Potassium: 3.9 mmol/L (ref 3.5–5.5)
Sodium: 139 mmol/L (ref 136–145)

## 2016-03-05 LAB — URINALYSIS W/ RFLX MICROSCOPIC
Bilirubin: NEGATIVE
Blood: NEGATIVE
Glucose: NEGATIVE mg/dL
Ketone: NEGATIVE mg/dL
Leukocyte Esterase: NEGATIVE
Nitrites: NEGATIVE
Protein: NEGATIVE mg/dL
Specific gravity: 1.02 (ref 1.005–1.030)
Urobilinogen: 1 EU/dL (ref 0.2–1.0)
pH (UA): 7.5 (ref 5.0–8.0)

## 2016-03-05 MED ORDER — ONDANSETRON (PF) 4 MG/2 ML INJECTION
4 mg/2 mL | INTRAMUSCULAR | Status: AC
Start: 2016-03-05 — End: 2016-03-05
  Administered 2016-03-05: 13:00:00 via INTRAVENOUS

## 2016-03-05 MED ORDER — HYDROMORPHONE (PF) 1 MG/ML IJ SOLN
1 mg/mL | Freq: Once | INTRAMUSCULAR | Status: AC
Start: 2016-03-05 — End: 2016-03-05
  Administered 2016-03-05: 13:00:00 via INTRAVENOUS

## 2016-03-05 MED ORDER — SODIUM CHLORIDE 0.9% BOLUS IV
0.9 % | Freq: Once | INTRAVENOUS | Status: AC
Start: 2016-03-05 — End: 2016-03-05
  Administered 2016-03-05: 13:00:00 via INTRAVENOUS

## 2016-03-05 MED ORDER — KETOROLAC TROMETHAMINE 30 MG/ML INJECTION
30 mg/mL (1 mL) | INTRAMUSCULAR | Status: AC
Start: 2016-03-05 — End: 2016-03-05
  Administered 2016-03-05: 13:00:00 via INTRAVENOUS

## 2016-03-05 MED ORDER — HYDROCODONE-ACETAMINOPHEN 5 MG-325 MG TAB
5-325 mg | ORAL_TABLET | ORAL | 0 refills | Status: DC | PRN
Start: 2016-03-05 — End: 2016-03-13

## 2016-03-05 MED FILL — SODIUM CHLORIDE 0.9 % IV: INTRAVENOUS | Qty: 1000

## 2016-03-05 MED FILL — HYDROMORPHONE (PF) 1 MG/ML IJ SOLN: 1 mg/mL | INTRAMUSCULAR | Qty: 1

## 2016-03-05 MED FILL — ONDANSETRON (PF) 4 MG/2 ML INJECTION: 4 mg/2 mL | INTRAMUSCULAR | Qty: 2

## 2016-03-05 MED FILL — KETOROLAC TROMETHAMINE 30 MG/ML INJECTION: 30 mg/mL (1 mL) | INTRAMUSCULAR | Qty: 1

## 2016-03-05 NOTE — ED Provider Notes (Signed)
HPI Comments: 8:54 AM Brandon Alvarado is a 40 y.o. male with a history of HTN an who presents to the emergency department c/o R flank pain onset 4 days ago. The patient explains Saturday when he got off of work he passed a kidney stone while urinating. Pt notes that he has passed kidney stones in the past while living in [place], and was prescribed medications that helped temporarily. Pt reports associated symptoms of hematuria, nausea, and vomiting. Pt admits tobacco usage. No other concerns at this time.       PCP: None      The history is provided by the patient.        Past Medical History:   Diagnosis Date   ??? Hypertension    ??? Kidney stone        History reviewed. No pertinent surgical history.      History reviewed. No pertinent family history.    Social History     Social History   ??? Marital status: SINGLE     Spouse name: N/A   ??? Number of children: N/A   ??? Years of education: N/A     Occupational History   ??? Not on file.     Social History Main Topics   ??? Smoking status: Not on file   ??? Smokeless tobacco: Not on file   ??? Alcohol use Not on file   ??? Drug use: Not on file   ??? Sexual activity: Not on file     Other Topics Concern   ??? Not on file     Social History Narrative   ??? No narrative on file         ALLERGIES: Review of patient's allergies indicates no known allergies.    Review of Systems   Constitutional: Negative for chills, fatigue and fever.   HENT: Negative for congestion, rhinorrhea and sore throat.    Respiratory: Negative for cough and shortness of breath.    Cardiovascular: Negative for chest pain and palpitations.   Gastrointestinal: Positive for nausea and vomiting. Negative for abdominal pain and diarrhea.   Genitourinary: Positive for flank pain (R) and hematuria. Negative for dysuria and urgency.   Musculoskeletal: Negative for myalgias.   Skin: Negative for rash and wound.   Neurological: Negative for dizziness and headaches.   All other systems reviewed and are negative.      Vitals:     03/05/16 0900 03/05/16 0945 03/05/16 1000 03/05/16 1015   BP: (!) 169/118 (!) 169/114 (!) 173/108 (!) 157/109   Pulse:       Resp:       Temp:       SpO2: 100% 99% 98% 99%   Weight:       Height:                Physical Exam   Constitutional: He is oriented to person, place, and time. He appears well-developed and well-nourished. No distress.   Uncomfortable appearing.    HENT:   Head: Normocephalic and atraumatic.   Mouth/Throat: Oropharynx is clear and moist.   Eyes: Conjunctivae and EOM are normal. Pupils are equal, round, and reactive to light. No scleral icterus.   Neck: Normal range of motion. Neck supple.   Cardiovascular: Normal rate, regular rhythm and normal heart sounds.    No murmur heard.  Pulmonary/Chest: Effort normal and breath sounds normal. No respiratory distress.   Abdominal: Soft. Bowel sounds are normal. He exhibits no distension. There is no  tenderness.   No tenderness currently.    Musculoskeletal: He exhibits no edema.   Lymphadenopathy:     He has no cervical adenopathy.   Neurological: He is alert and oriented to person, place, and time. Coordination normal.   Skin: Skin is warm and dry. No rash noted.   Psychiatric: He has a normal mood and affect. His behavior is normal.   Nursing note and vitals reviewed.       MDM  Number of Diagnoses or Management Options  Renal colic:   Diagnosis management comments: r flank pain h/o renal colic ct and ua reviewed pain improved will dc home        Amount and/or Complexity of Data Reviewed  Clinical lab tests: ordered and reviewed  Tests in the radiology section of CPT??: ordered and reviewed      ED Course       Procedures    Vitals:  Patient Vitals for the past 12 hrs:   Temp Pulse Resp BP SpO2   03/05/16 1015 - - - (!) 157/109 99 %   03/05/16 1000 - - - (!) 173/108 98 %   03/05/16 0945 - - - (!) 169/114 99 %   03/05/16 0900 - - - (!) 169/118 100 %   03/05/16 0853 - - - (!) 156/107 -   03/05/16 0839 97.7 ??F (36.5 ??C) 89 18 (!) 165/119 100 %          Medications ordered:   Medications   sodium chloride 0.9 % bolus infusion 1,000 mL (1,000 mL IntraVENous New Bag 03/05/16 0911)   ketorolac (TORADOL) injection 30 mg (30 mg IntraVENous Given 03/05/16 0911)   HYDROmorphone (PF) (DILAUDID) injection 1 mg (1 mg IntraVENous Given 03/05/16 0911)   ondansetron (ZOFRAN) injection 4 mg (4 mg IntraVENous Given 03/05/16 0911)         Lab findings:  Recent Results (from the past 12 hour(s))   CBC WITH AUTOMATED DIFF    Collection Time: 03/05/16  9:00 AM   Result Value Ref Range    WBC 4.9 4.6 - 13.2 K/uL    RBC 4.74 4.70 - 5.50 M/uL    HGB 14.5 13.0 - 16.0 g/dL    HCT 82.942.5 56.236.0 - 13.048.0 %    MCV 89.7 74.0 - 97.0 FL    MCH 30.6 24.0 - 34.0 PG    MCHC 34.1 31.0 - 37.0 g/dL    RDW 86.513.3 78.411.6 - 69.614.5 %    PLATELET 230 135 - 420 K/uL    MPV 10.5 9.2 - 11.8 FL    NEUTROPHILS 47 40 - 73 %    LYMPHOCYTES 42 21 - 52 %    MONOCYTES 9 3 - 10 %    EOSINOPHILS 2 0 - 5 %    BASOPHILS 0 0 - 2 %    ABS. NEUTROPHILS 2.3 1.8 - 8.0 K/UL    ABS. LYMPHOCYTES 2.0 0.9 - 3.6 K/UL    ABS. MONOCYTES 0.4 0.05 - 1.2 K/UL    ABS. EOSINOPHILS 0.1 0.0 - 0.4 K/UL    ABS. BASOPHILS 0.0 0.0 - 0.06 K/UL    DF AUTOMATED     METABOLIC PANEL, BASIC    Collection Time: 03/05/16  9:00 AM   Result Value Ref Range    Sodium 139 136 - 145 mmol/L    Potassium 3.9 3.5 - 5.5 mmol/L    Chloride 106 100 - 108 mmol/L    CO2 24 21 - 32 mmol/L  Anion gap 9 3.0 - 18 mmol/L    Glucose 138 (H) 74 - 99 mg/dL    BUN 10 7.0 - 18 MG/DL    Creatinine 1.61 0.6 - 1.3 MG/DL    BUN/Creatinine ratio 9 (L) 12 - 20      GFR est AA >60 >60 ml/min/1.25m2    GFR est non-AA >60 >60 ml/min/1.51m2    Calcium 8.8 8.5 - 10.1 MG/DL   URINALYSIS W/ RFLX MICROSCOPIC    Collection Time: 03/05/16 10:30 AM   Result Value Ref Range    Color YELLOW      Appearance CLEAR      Specific gravity 1.020 1.005 - 1.030      pH (UA) 7.5 5.0 - 8.0      Protein NEGATIVE  NEG mg/dL    Glucose NEGATIVE  NEG mg/dL    Ketone NEGATIVE  NEG mg/dL    Bilirubin NEGATIVE  NEG       Blood NEGATIVE  NEG      Urobilinogen 1.0 0.2 - 1.0 EU/dL    Nitrites NEGATIVE  NEG      Leukocyte Esterase NEGATIVE  NEG               X-Ray, CT or other radiology findings or impressions:  CT ABD PELV WO CONT    (Results )  IMPRESSION:  ??  ??  1. No renal or ureteral calculi. No signs of obstructive uropathy.  2. Normal appendix.  3. Mild intramural fat right colon around to the mid transverse colon, normal  finding versus chronic sequela of inflammatory bowel disease. No evidence of  acute bowel inflammation or obstruction.  4. Questionable wall thickening of the urinary bladder raising the possibility  of cystitis.       Orders:  Orders Placed This Encounter   ??? CT ABD PELV WO CONT     Standing Status:   Standing     Number of Occurrences:   1     Order Specific Question:   Transport     Answer:   Doctor, general practice [5]     Order Specific Question:   Is Patient Allergic to Contrast Dye?     Answer:   No   ??? URINALYSIS W/ RFLX MICROSCOPIC     Standing Status:   Standing     Number of Occurrences:   1   ??? CBC WITH AUTOMATED DIFF     Standing Status:   Standing     Number of Occurrences:   1   ??? METABOLIC PANEL, BASIC     Standing Status:   Standing     Number of Occurrences:   1   ??? sodium chloride 0.9 % bolus infusion 1,000 mL   ??? ketorolac (TORADOL) injection 30 mg   ??? HYDROmorphone (PF) (DILAUDID) injection 1 mg   ??? ondansetron (ZOFRAN) injection 4 mg   ??? HYDROcodone-acetaminophen (NORCO) 5-325 mg per tablet     Sig: Take 1 Tab by mouth every four (4) hours as needed for Pain. Max Daily Amount: 6 Tabs.     Dispense:  6 Tab     Refill:  0   ??? LIFE COACH CONSULT     Standing Status:   Standing     Number of Occurrences:   1     Order Specific Question:   Reason for Consult:     Answer:   medical follow up       Progress notes, Consult notes, or  additional Procedure notes:       Disposition:  Diagnosis:   1. Renal colic        Disposition:     Follow-up Information     Follow up With Details Comments Contact Info     Jenene Slicker, MD On 03/13/2016 at 1:30PM check in at 1PM [address]  [address]  [place] [address]  [phone]      [facility] EMERGENCY DEPT  As needed, If symptoms worsen [address]  [place] [address]  [phone]           Patient's Medications   Start Taking    HYDROCODONE-ACETAMINOPHEN (NORCO) 5-325 MG PER TABLET    Take 1 Tab by mouth every four (4) hours as needed for Pain. Max Daily Amount: 6 Tabs.   Continue Taking    No medications on file   These Medications have changed    No medications on file   Stop Taking    No medications on file             Scribe Attestation  Leslie Andrea scribing for and in presence of Burtis Junes, MD (03/05/16)      Physician Attestation  I personally preformed the services described in this documentation, reviewed and edited the documentation which was dictated to the scribe in my presence, and it accurately records my words and actions.     Burtis Junes, MD (03/05/16)      Signed by: Leslie Andrea, Scribe, 03/05/16, 8:54 AM

## 2016-03-05 NOTE — Progress Notes (Signed)
Referral received to assist pt with PCP follow up. Met with pt and agreed for me to assist. Appointment scheduled with Dr Murlean Hark on 03/13/16 at 1330 check in at 1300. Appointment card, [facility] contact #, [facility] and prescription discount card given to pt.

## 2016-03-05 NOTE — ED Notes (Signed)
Pt being discharged home. Discharge instructions given, and pt expresses understanding. Discontinued IV and helped patient to gather belongings. Pt discharged with family member. Prescription given for norco.

## 2016-03-05 NOTE — ED Triage Notes (Signed)
"  I have pain in my right lower back. I've had kidney stones."

## 2016-03-13 ENCOUNTER — Inpatient Hospital Stay: Admit: 2016-03-13 | Payer: MEDICAID | Primary: Nurse Practitioner

## 2016-03-13 ENCOUNTER — Ambulatory Visit
Admit: 2016-03-13 | Discharge: 2016-03-13 | Payer: PRIVATE HEALTH INSURANCE | Attending: Internal Medicine | Primary: Nurse Practitioner

## 2016-03-13 DIAGNOSIS — M549 Dorsalgia, unspecified: Secondary | ICD-10-CM

## 2016-03-13 DIAGNOSIS — I10 Essential (primary) hypertension: Secondary | ICD-10-CM

## 2016-03-13 DIAGNOSIS — M47814 Spondylosis without myelopathy or radiculopathy, thoracic region: Secondary | ICD-10-CM

## 2016-03-13 LAB — C REACTIVE PROTEIN, QT: C-Reactive protein: 0.6 mg/dL — ABNORMAL HIGH (ref 0–0.3)

## 2016-03-13 MED ORDER — OXYCODONE-ACETAMINOPHEN 5 MG-325 MG TAB
5-325 mg | ORAL_TABLET | Freq: Three times a day (TID) | ORAL | 0 refills | Status: DC | PRN
Start: 2016-03-13 — End: 2016-03-27

## 2016-03-13 MED ORDER — HYDROCHLOROTHIAZIDE 25 MG TAB
25 mg | ORAL_TABLET | Freq: Every day | ORAL | 3 refills | Status: DC
Start: 2016-03-13 — End: 2016-05-26

## 2016-03-13 MED ORDER — CYCLOBENZAPRINE 10 MG TAB
10 mg | ORAL_TABLET | Freq: Three times a day (TID) | ORAL | 1 refills | Status: DC | PRN
Start: 2016-03-13 — End: 2016-06-26

## 2016-03-13 MED ORDER — LISINOPRIL 10 MG TAB
10 mg | ORAL_TABLET | Freq: Every day | ORAL | 3 refills | Status: DC
Start: 2016-03-13 — End: 2016-03-27

## 2016-03-13 NOTE — Progress Notes (Signed)
Brandon Alvarado is a 40 y.o.  male and presents with     Chief Complaint   Patient presents with   ??? Hypertension   ??? Back Pain   ??? Kidney Stone       Pt is originally from [place]. 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  Pt says he has h/o kidney stones. Pt went to ER with back pain that started a month ago. Since pt has h/o kidney stones- CT scan was done - that did not show any stones. But is it did mention possibility of inflammation in the colon.  Pt went to [facility] in December with rectal bleeding. CT abdomen at that time was unremarkable.  Pt says his bac hurts and the medicien is not helping him.  Pt is not sure if he pulled a muscle.  Pt also has h/o HTN. He ran out of meds 2 months back.    Past Medical History:   Diagnosis Date   ??? Hypertension    ??? Kidney stone      History reviewed. No pertinent surgical history.  Current Outpatient Prescriptions   Medication Sig   ??? hydroCHLOROthiazide (HYDRODIURIL) 25 mg tablet Take 1 Tab by mouth daily.   ??? lisinopril (PRINIVIL, ZESTRIL) 10 mg tablet Take 1 Tab by mouth daily.   ??? cyclobenzaprine (FLEXERIL) 10 mg tablet Take 1 Tab by mouth three (3) times daily as needed for Muscle Spasm(s).   ??? oxyCODONE-acetaminophen (PERCOCET) 5-325 mg per tablet Take 1 Tab by mouth every eight (8) hours as needed for Pain. Max Daily Amount: 3 Tabs.     No current facility-administered medications for this visit.      Health Maintenance   Topic Date Due   ??? DTaP/Tdap/Td series (1 - Tdap) 09/15/1996   ??? INFLUENZA AGE 71 TO ADULT  05/13/2016       There is no immunization history on file for this patient.  No LMP for male patient.        Allergies and Intolerances:   No Known Allergies    Family History:   History reviewed. No pertinent family history.    Social History:   He  reports that he has been smoking Cigarettes.  He has never used smokeless tobacco.  He  reports that he does not drink alcohol.            Review of Systems:    General: negative for - chills, fatigue, fever, weight change  Psych: negative for - anxiety, depression, irritability or mood swings  ENT: negative for - headaches, hearing change, nasal congestion, oral lesions, sneezing or sore throat  Heme/ Lymph: negative for - bleeding problems, bruising, pallor or swollen lymph nodes  Endo: negative for - hot flashes, polydipsia/polyuria or temperature intolerance  Resp: negative for - cough, shortness of breath or wheezing  CV: negative for - chest pain, edema or palpitations  GI: negative for - abdominal pain, change in bowel habits, constipation, diarrhea or nausea/vomiting  GU: negative for - dysuria, hematuria, incontinence, pelvic pain or vulvar/vaginal symptoms  MSK: negative for - joint pain, joint swelling or muscle pain, pos for back pain  Neuro: negative for - confusion, headaches, seizures or weakness  Derm: negative for - dry skin, hair changes, rash or skin lesion changes          Physical:   Vitals:   Vitals:    03/13/16 1400   BP: (!) 175/111  Pulse: 99   Resp: 18   Temp: 97.6 ??F (36.4 ??C)   TempSrc: Oral   SpO2: 97%   Weight: 214 lb (97.1 kg)   Height: _0  (1.651 m)           Exam:   HEENT- atraumatic,normocephalic, awake, oriented, well nourished  Neck - supple,no enlarged lymph nodes, no JVD, no thyromegaly  Chest- CTA, no rhonchi, no crackles  Heart- rrr, no murmurs / gallop/rub  Abdomen- soft,BS+,NT, no hepatosplenomegaly  Ext - no c/c/edema   Neuro- no focal deficits.Power 5/5 all extremities  Skin - warm,dry, no obvious rashes.          Review of Data:   LABS:   Lab Results   Component Value Date/Time    WBC 4.9 03/05/2016 09:00 AM    HGB 14.5 03/05/2016 09:00 AM    HCT 42.5 03/05/2016 09:00 AM    PLATELET 230 03/05/2016 09:00 AM     Lab Results   Component Value Date/Time    Sodium 139 03/05/2016 09:00 AM    Potassium 3.9 03/05/2016 09:00 AM    Chloride 106 03/05/2016 09:00 AM    CO2 24 03/05/2016 09:00 AM    Glucose 138 03/05/2016 09:00 AM     BUN 10 03/05/2016 09:00 AM    Creatinine 1.12 03/05/2016 09:00 AM     No results found for: CHOL, CHOLX, CHLST, CHOLV, HDL, LDL, DLDL, LDLC, DLDLP, TGL, TGLX, TRIGL, TRIGP  No results found for: GPT        Impression / Plan:        ICD-10-CM ICD-9-CM    1. Essential hypertension I10 401.9 hydroCHLOROthiazide (HYDRODIURIL) 25 mg tablet      lisinopril (PRINIVIL, ZESTRIL) 10 mg tablet   2. Rectal bleeding K62.5 569.3 REFERRAL TO GENERAL SURGERY   3. IFG (impaired fasting glucose) R73.01 790.21 AMB POC HEMOGLOBIN A1C   4. Acute midline back pain, unspecified back location M54.9 724.5 XR SPINE LUMB 2 OR 3 V      [id number]      SED RATE (ESR)      C REACTIVE PROTEIN, QT      cyclobenzaprine (FLEXERIL) 10 mg tablet      oxyCODONE-acetaminophen (PERCOCET) 5-325 mg per tablet   5. Inguinal hernia of left side without obstruction or gangrene K40.90 550.90      PMP reviewed.        Explained to patient risk benefits of the medications.Advised patient to stop meds if having any side effects.Pt verbalized understanding of the instructions.    I have discussed the diagnosis with the patient and the intended plan as seen in the above orders.  The patient has received an after-visit summary and questions were answered concerning future plans.  I have discussed medication side effects and warnings with the patient as well. I have reviewed the plan of care with the patient, accepted their input and they are in agreement with the treatment goals.     Reviewed plan of care. Patient has provided input and agrees with goals.    Follow-up Disposition: Not on File    Elnora Morrison, MD

## 2016-03-14 LAB — SED RATE (ESR): Sed rate, automated: 5 mm/hr (ref 0–15)

## 2016-03-18 LAB — HLA-B27
HLA-B27: NEGATIVE
HLA-B27: NEGATIVE

## 2016-03-18 NOTE — Telephone Encounter (Signed)
Please result patient labs.

## 2016-03-18 NOTE — Telephone Encounter (Signed)
Pt calling to obtain lab results. Please assist.

## 2016-03-19 NOTE — Telephone Encounter (Signed)
Labs are normal.  Xray shows - T11-T12 degenerative  discogenic changes.

## 2016-03-19 NOTE — Telephone Encounter (Signed)
Spoke with patient, pt verbalized understanding, this encounter will be closed.

## 2016-03-27 ENCOUNTER — Ambulatory Visit
Admit: 2016-03-27 | Discharge: 2016-03-27 | Payer: PRIVATE HEALTH INSURANCE | Attending: Internal Medicine | Primary: Nurse Practitioner

## 2016-03-27 DIAGNOSIS — G8929 Other chronic pain: Secondary | ICD-10-CM

## 2016-03-27 LAB — AMB POC HEMOGLOBIN A1C: Hemoglobin A1c (POC): 5.8 %

## 2016-03-27 MED ORDER — OXYCODONE-ACETAMINOPHEN 5 MG-325 MG TAB
5-325 mg | ORAL_TABLET | Freq: Three times a day (TID) | ORAL | 0 refills | Status: DC | PRN
Start: 2016-03-27 — End: 2016-06-26

## 2016-03-27 MED ORDER — LISINOPRIL 20 MG TAB
20 mg | ORAL_TABLET | Freq: Two times a day (BID) | ORAL | 3 refills | Status: DC
Start: 2016-03-27 — End: 2016-05-26

## 2016-03-27 NOTE — Progress Notes (Signed)
Brandon Alvarado is a 40 y.o.  male and presents with     Chief Complaint   Patient presents with   ??? Hypertension   ??? Back Pain   ??? Rectal Bleeding       Pt has back pain and also has rectal bleeding. Pt does have apt with D Derrill Memohong lee.  Pt is taking his bllod pressure meds as directed. HTN runs in his family.  No cancer in family.  Pt deneis constipation.      Past Medical History:   Diagnosis Date   ??? Hypertension    ??? Kidney stone      No past surgical history on file.  Current Outpatient Prescriptions   Medication Sig   ??? lisinopril (PRINIVIL, ZESTRIL) 20 mg tablet Take 1 Tab by mouth two (2) times a day.   ??? oxyCODONE-acetaminophen (PERCOCET) 5-325 mg per tablet Take 1 Tab by mouth every eight (8) hours as needed for Pain. Max Daily Amount: 3 Tabs.   ??? hydroCHLOROthiazide (HYDRODIURIL) 25 mg tablet Take 1 Tab by mouth daily.   ??? cyclobenzaprine (FLEXERIL) 10 mg tablet Take 1 Tab by mouth three (3) times daily as needed for Muscle Spasm(s).     No current facility-administered medications for this visit.      Health Maintenance   Topic Date Due   ??? Pneumococcal 19-64 Medium Risk (1 of 1 - PPSV23) 09/15/1994   ??? DTaP/Tdap/Td series (1 - Tdap) 09/15/1996   ??? INFLUENZA AGE 89 TO ADULT  05/13/2016       There is no immunization history on file for this patient.  No LMP for male patient.        Allergies and Intolerances:   No Known Allergies    Family History:   No family history on file.    Social History:   He  reports that he has been smoking Cigarettes.  He has never used smokeless tobacco.  He  reports that he does not drink alcohol.            Review of Systems:   General: negative for - chills, fatigue, fever, weight change  Psych: negative for - anxiety, depression, irritability or mood swings  ENT: negative for - headaches, hearing change, nasal congestion, oral lesions, sneezing or sore throat  Heme/ Lymph: negative for - bleeding problems, bruising, pallor or swollen lymph nodes   Endo: negative for - hot flashes, polydipsia/polyuria or temperature intolerance  Resp: negative for - cough, shortness of breath or wheezing  CV: negative for - chest pain, edema or palpitations  GI: negative for - abdominal pain, change in bowel habits, constipation, diarrhea or nausea/vomiting,pso for rectal bleeding  GU: negative for - dysuria, hematuria, incontinence, pelvic pain or vulvar/vaginal symptoms  MSK: negative for - joint pain, joint swelling or muscle pain, pos for back pain  Neuro: negative for - confusion, headaches, seizures or weakness  Derm: negative for - dry skin, hair changes, rash or skin lesion changes          Physical:   Vitals:   Vitals:    03/27/16 1015   BP: (!) 152/117   Pulse: (!) 106   Temp: 98.1 ??F (36.7 ??C)   TempSrc: Oral   SpO2: 95%   Weight: 215 lb (97.5 kg)   Height: 5\' 5"  (1.651 m)           Exam:   HEENT- atraumatic,normocephalic, awake, oriented, well nourished  Neck - supple,no enlarged lymph nodes, no  JVD, no thyromegaly  Chest- CTA, no rhonchi, no crackles  Heart- rrr, no murmurs / gallop/rub  Abdomen- soft,BS+,NT, no hepatosplenomegaly  Ext - no c/c/edema   Neuro- no focal deficits.Power 5/5 all extremities  Skin - warm,dry, no obvious rashes.          Review of Data:   LABS:   Lab Results   Component Value Date/Time    WBC 4.9 03/05/2016 09:00 AM    HGB 14.5 03/05/2016 09:00 AM    HCT 42.5 03/05/2016 09:00 AM    PLATELET 230 03/05/2016 09:00 AM     Lab Results   Component Value Date/Time    Sodium 139 03/05/2016 09:00 AM    Potassium 3.9 03/05/2016 09:00 AM    Chloride 106 03/05/2016 09:00 AM    CO2 24 03/05/2016 09:00 AM    Glucose 138 03/05/2016 09:00 AM    BUN 10 03/05/2016 09:00 AM    Creatinine 1.12 03/05/2016 09:00 AM     No results found for: CHOL, CHOLX, CHLST, CHOLV, HDL, LDL, LDLC, DLDLP, TGLX, TRIGL, TRIGP  No results found for: GPT        Impression / Plan:        ICD-10-CM ICD-9-CM    1. Chronic bilateral back pain, unspecified back location M54.9 724.5  REFERRAL TO ORTHOPEDICS    G89.29 338.29    2. Essential hypertension I10 401.9 lisinopril (PRINIVIL, ZESTRIL) 20 mg tablet   3. Acute midline back pain, unspecified back location M54.9 724.5 oxyCODONE-acetaminophen (PERCOCET) 5-325 mg per tablet     Keep appt with [facility] rectal surg.      Explained to patient risk benefits of the medications.Advised patient to stop meds if having any side effects.Pt verbalized understanding of the instructions.    I have discussed the diagnosis with the patient and the intended plan as seen in the above orders.  The patient has received an after-visit summary and questions were answered concerning future plans.  I have discussed medication side effects and warnings with the patient as well. I have reviewed the plan of care with the patient, accepted their input and they are in agreement with the treatment goals.     Reviewed plan of care. Patient has provided input and agrees with goals.    Follow-up Disposition: Not on File    Jenene Slicker, MD

## 2016-03-27 NOTE — Progress Notes (Signed)
1. Have you been to the ER, urgent care clinic since your last visit?  Hospitalized since your last visit?No    2. Have you seen or consulted any other health care providers outside of the [facility] since your last visit?  Include any pap smears or colon screening. No

## 2016-04-22 ENCOUNTER — Encounter: Admit: 2016-04-22 | Discharge: 2016-04-22 | Attending: Surgery | Primary: Nurse Practitioner

## 2016-04-30 ENCOUNTER — Encounter: Attending: Surgery | Primary: Nurse Practitioner

## 2016-05-26 ENCOUNTER — Ambulatory Visit
Admit: 2016-05-26 | Discharge: 2016-05-26 | Payer: PRIVATE HEALTH INSURANCE | Attending: Internal Medicine | Primary: Nurse Practitioner

## 2016-05-26 DIAGNOSIS — G8929 Other chronic pain: Secondary | ICD-10-CM

## 2016-05-26 MED ORDER — LOSARTAN 50 MG TAB
50 mg | ORAL_TABLET | Freq: Two times a day (BID) | ORAL | 3 refills | Status: DC
Start: 2016-05-26 — End: 2016-11-20

## 2016-05-26 MED ORDER — HYDROCHLOROTHIAZIDE 25 MG TAB
25 mg | ORAL_TABLET | Freq: Every day | ORAL | 3 refills | Status: DC
Start: 2016-05-26 — End: 2017-12-07

## 2016-05-26 MED ORDER — METOPROLOL SUCCINATE SR 50 MG 24 HR TAB
50 mg | ORAL_TABLET | Freq: Every day | ORAL | 3 refills | Status: DC
Start: 2016-05-26 — End: 2016-06-12

## 2016-05-26 NOTE — Progress Notes (Signed)
1. Have you been to the ER, urgent care clinic since your last visit?  Hospitalized since your last visit?No    2. Have you seen or consulted any other health care providers outside of the [facility] since your last visit?  Include any pap smears or colon screening. No

## 2016-05-26 NOTE — Progress Notes (Signed)
Brandon Alvarado is a 40 y.o.  male and presents with     Chief Complaint   Patient presents with   ??? Hypertension   ??? Back Pain   ??? Gas   ??? Sore Throat       Pt has been having back pain. Pt says it radiates down both legs.  Pt also gives h/ o left ankle injury and has metal in the left foot. He is wondering if it needs to come out.  Pt also feels itching to the back of his throat.  Pt feels bloated and  Does not feel like eating.Although he has not lost any weight.  Pt does have h/o kidney stones. Sis ter has Crohns.    Past Medical History:   Diagnosis Date   ??? Hypertension    ??? Kidney stone      No past surgical history on file.  Current Outpatient Prescriptions   Medication Sig   ??? hydroCHLOROthiazide (HYDRODIURIL) 25 mg tablet Take 1 Tab by mouth daily.   ??? losartan (COZAAR) 50 mg tablet Take 1 Tab by mouth two (2) times a day.   ??? metoprolol succinate (TOPROL-XL) 50 mg XL tablet Take 1 Tab by mouth daily.   ??? oxyCODONE-acetaminophen (PERCOCET) 5-325 mg per tablet Take 1 Tab by mouth every eight (8) hours as needed for Pain. Max Daily Amount: 3 Tabs.   ??? cyclobenzaprine (FLEXERIL) 10 mg tablet Take 1 Tab by mouth three (3) times daily as needed for Muscle Spasm(s).     No current facility-administered medications for this visit.      Health Maintenance   Topic Date Due   ??? Pneumococcal 19-64 Medium Risk (1 of 1 - PPSV23) 09/15/1994   ??? DTaP/Tdap/Td series (1 - Tdap) 09/15/1996   ??? INFLUENZA AGE 75 TO ADULT  05/13/2016       There is no immunization history on file for this patient.  No LMP for male patient.        Allergies and Intolerances:   Allergies   Allergen Reactions   ??? Lisinopril Other (comments)     Sore throat       Family History:   No family history on file.    Social History:   He  reports that he has been smoking Cigarettes.  He has never used smokeless tobacco.  He  reports that he does not drink alcohol.            Review of Systems:   General: negative for - chills, fatigue, fever, weight change   Psych: negative for - anxiety, depression, irritability or mood swings  ENT: negative for - headaches, hearing change, nasal congestion, oral lesions, sneezing or sore throat  Heme/ Lymph: negative for - bleeding problems, bruising, pallor or swollen lymph nodes  Endo: negative for - hot flashes, polydipsia/polyuria or temperature intolerance  Resp: negative for - cough, shortness of breath or wheezing  CV: negative for - chest pain, edema or palpitations  GI: negative for - abdominal pain, change in bowel habits, constipation, diarrhea or nausea/vomiting  GU: negative for - dysuria, hematuria, incontinence, pelvic pain or vulvar/vaginal symptoms  MSK: negative for - joint pain, joint swelling or muscle pain, pos for back pain  Neuro: negative for - confusion, headaches, seizures or weakness  Derm: negative for - dry skin, hair changes, rash or skin lesion changes          Physical:   Vitals:   Vitals:    05/26/16  [id number] 05/26/16 0944   BP: (!) 151/109 (!) 156/100   Pulse: 94    Resp: 16    Temp: 96.8 ??F (36 ??C)    TempSrc: Oral    SpO2: 96%    Weight: 215 lb 12.8 oz (97.9 kg)    Height: 5\' 5"  (1.651 m)            Exam:   HEENT- atraumatic,normocephalic, awake, oriented, well nourished  Neck - supple,no enlarged lymph nodes, no JVD, no thyromegaly  Chest- CTA, no rhonchi, no crackles  Heart- rrr, no murmurs / gallop/rub  Abdomen- soft,BS+,NT, no hepatosplenomegaly  Ext - no c/c/edema   Neuro- no focal deficits.Power 5/5 all extremities  Skin - warm,dry, no obvious rashes.          Review of Data:   LABS:   Lab Results   Component Value Date/Time    WBC 4.9 03/05/2016 09:00 AM    HGB 14.5 03/05/2016 09:00 AM    HCT 42.5 03/05/2016 09:00 AM    PLATELET 230 03/05/2016 09:00 AM     Lab Results   Component Value Date/Time    Sodium 139 03/05/2016 09:00 AM    Potassium 3.9 03/05/2016 09:00 AM    Chloride 106 03/05/2016 09:00 AM    CO2 24 03/05/2016 09:00 AM    Glucose 138 03/05/2016 09:00 AM    BUN 10 03/05/2016 09:00 AM     Creatinine 1.12 03/05/2016 09:00 AM     No results found for: CHOL, CHOLX, CHLST, CHOLV, HDL, LDL, LDLC, DLDLP, TGLX, TRIGL, TRIGP  No results found for: GPT        Impression / Plan:        ICD-10-CM ICD-9-CM    1. Chronic pain of left ankle M25.572 719.47 REFERRAL TO ORTHOPEDICS    G89.29 338.29    2. Essential hypertension I10 401.9 hydroCHLOROthiazide (HYDRODIURIL) 25 mg tablet      losartan (COZAAR) 50 mg tablet      metoprolol succinate (TOPROL-XL) 50 mg XL tablet      LIPID PANEL   3. History of kidney stones Z87.442 V13.01 URINALYSIS W/ RFLX MICROSCOPIC   4. Abdominal bloating R14.0 787.3 US ABD COMP      METABOLIC PANEL, COMPREHENSIVE   5. Bilateral back pain, unspecified back location, unspecified chronicity M54.9 724.5          Explained to patient risk benefits of the medications.Advised patient to stop meds if having any side effects.Pt verbalized understanding of the instructions.    I have discussed the diagnosis with the patient and the intended plan as seen in the above orders.  The patient has received an after-visit summary and questions were answered concerning future plans.  I have discussed medication side effects and warnings with the patient as well. I have reviewed the plan of care with the patient, accepted their input and they are in agreement with the treatment goals.     Reviewed plan of care. Patient has provided input and agrees with goals.    Follow-up Disposition: Not on File    Jenene SlickerPravin M Cypher Paule, MD

## 2016-05-30 ENCOUNTER — Inpatient Hospital Stay: Admit: 2016-05-30 | Payer: MEDICAID | Attending: Internal Medicine | Primary: Nurse Practitioner

## 2016-05-30 DIAGNOSIS — R10816 Epigastric abdominal tenderness: Secondary | ICD-10-CM

## 2016-06-02 ENCOUNTER — Ambulatory Visit: Admit: 2016-06-02 | Discharge: 2016-06-02 | Attending: Surgery | Primary: Nurse Practitioner

## 2016-06-02 DIAGNOSIS — K625 Hemorrhage of anus and rectum: Secondary | ICD-10-CM

## 2016-06-02 MED ORDER — PEG 3350-ELECTROLYTES 236 G-22.74 G-6.74 G-5.86 G ORAL SOLUTION
ORAL | 0 refills | Status: DC
Start: 2016-06-02 — End: 2016-06-26

## 2016-06-02 NOTE — Progress Notes (Signed)
[facility]  [address], [address]  [place], [place] [id number]        Colon and Rectal Surgery    Subjective:      Brandon Alvarado is a 40 y.o. male who presents for colonoscopy consultation for repeated episodes of bright red rectal bleeding often mixed with stool.  This has been on and off for the past 15 years.    The patient denies any anorectal pain, change in bowel habits, weight changes, nor any abdominal pain.   Patient denies constipation, vomiting, diarrhea, mucousy stools, loss of appetite, reflux and nausea.    There is not a family history of colon cancer.  His sister has history of Crohn's disease.        Patient Active Problem List    Diagnosis Date Noted   ??? History of kidney stones 05/26/2016   ??? Essential hypertension 05/26/2016   ??? Inguinal hernia of left side without obstruction or gangrene 03/13/2016     Past Medical History:   Diagnosis Date   ??? Hypertension    ??? Kidney stone       No past surgical history on file.   Social History   Substance Use Topics   ??? Smoking status: Current Every Day Smoker     Types: Cigarettes   ??? Smokeless tobacco: Never Used   ??? Alcohol use No      Comment: ocassionally      No family history on file.   Prior to Admission medications    Medication Sig Start Date End Date Taking? Authorizing Provider   PEG 3350-Electrolytes (GO-LYTELY) 236-22.74-6.74 -5.86 gram suspension Take as directed for bowel prep  Indications: BOWEL EVACUATION 06/02/16  Yes Estil Dafthong S Kendyll Huettner, MD   hydroCHLOROthiazide (HYDRODIURIL) 25 mg tablet Take 1 Tab by mouth daily. 05/26/16  Yes Jenene SlickerPravin M Deshmukh, MD   losartan (COZAAR) 50 mg tablet Take 1 Tab by mouth two (2) times a day. 05/26/16  Yes Jenene SlickerPravin M Deshmukh, MD   metoprolol succinate (TOPROL-XL) 50 mg XL tablet Take 1 Tab by mouth daily. 05/26/16  Yes Jenene SlickerPravin M Deshmukh, MD   cyclobenzaprine (FLEXERIL) 10 mg tablet Take 1 Tab by mouth three (3) times daily as needed for Muscle Spasm(s). 03/13/16  Yes Jenene SlickerPravin M Deshmukh, MD    oxyCODONE-acetaminophen (PERCOCET) 5-325 mg per tablet Take 1 Tab by mouth every eight (8) hours as needed for Pain. Max Daily Amount: 3 Tabs. 03/27/16   Jenene SlickerPravin M Deshmukh, MD     Current Outpatient Prescriptions   Medication Sig   ??? PEG 3350-Electrolytes (GO-LYTELY) 236-22.74-6.74 -5.86 gram suspension Take as directed for bowel prep  Indications: BOWEL EVACUATION   ??? hydroCHLOROthiazide (HYDRODIURIL) 25 mg tablet Take 1 Tab by mouth daily.   ??? losartan (COZAAR) 50 mg tablet Take 1 Tab by mouth two (2) times a day.   ??? metoprolol succinate (TOPROL-XL) 50 mg XL tablet Take 1 Tab by mouth daily.   ??? cyclobenzaprine (FLEXERIL) 10 mg tablet Take 1 Tab by mouth three (3) times daily as needed for Muscle Spasm(s).   ??? oxyCODONE-acetaminophen (PERCOCET) 5-325 mg per tablet Take 1 Tab by mouth every eight (8) hours as needed for Pain. Max Daily Amount: 3 Tabs.     No current facility-administered medications for this visit.      Allergies   Allergen Reactions   ??? Lisinopril Other (comments)     Sore throat        Review of Systems:  A comprehensive review of systems was negative except for that written in the History of Present Illness.    Objective:     Visit Vitals   ??? BP (!) 175/115 (BP 1 Location: Right arm, BP Patient Position: Sitting)   ??? Pulse (!) 112   ??? Temp 97.6 ??F (36.4 ??C) (Oral)   ??? Resp 18   ??? Ht 5' 5" (1.651 m)   ??? Wt 96.7 kg (213 lb 3.2 oz)   ??? SpO2 98%   ??? BMI 35.48 kg/m2        Physical Exam:   GENERAL: alert, cooperative, no distress, appears stated age  LYMPHATIC: Cervical, supraclavicular, and axillary nodes normal.   THROAT & NECK: normal and no erythema or exudates noted.   LUNG: clear to auscultation bilaterally  HEART: regular rate and rhythm, S1, S2 normal, no murmur, click, rub or gallop  ABDOMEN: soft, non-tender. Bowel sounds normal. No masses,  no organomegaly  EXTREMITIES:  extremities normal, atraumatic, no cyanosis or edema  SKIN: Normal.  NEUROLOGIC: negative  PSYCHIATRIC: non focal           Assessment:     Brandon Alvarado is a 39 y.o. male who requires colonoscopy for chronic rectal bleeding symptoms.      Plan:     1. I recommend proceeding with colonoscopy.  The patient was in full agreement and was eager to proceed.    2. I discussed the details of the colonoscopy procedure.  The risks of colonoscopy were discussed including colon injury/perforation, anesthesia issues, bleeding, and the possibility of incomplete examination.  The patient was willing to accept these risks and proceed with the examination.  All questions were answered to the patient's satisfaction.     3. The patient was provided with the instructions in preparation for the colonoscopy procedure including the bowel prep recommendations.         Thank you for allowing me to participate in the patient's care.         Kamylle Axelson S. Jacek Colson, MD, FACS, FASCRS  Colon and Rectal Surgery   [facility]  Office [phone]  Fax     [phone]  06/02/2016  4:26 PM

## 2016-06-02 NOTE — Progress Notes (Signed)
Patient is a 40 y.o. male who presents for a surgical evaluation of rectal bleeding.

## 2016-06-02 NOTE — Patient Instructions (Signed)
If you have any questions or concerns about today's appointment, the verbal and/or written instructions you were given for follow up care, please call our office at [phone].    [facility]  [address], [address]  [place] [address]    [phone] office  [phone] fax

## 2016-06-02 NOTE — H&P (View-Only) (Signed)
[facility]  [address], [address]  [place], [place] [id number]        Colon and Rectal Surgery    Subjective:      Brandon Maiarnest Plush is a 40 y.o. male who presents for colonoscopy consultation for repeated episodes of bright red rectal bleeding often mixed with stool.  This has been on and off for the past 15 years.    The patient denies any anorectal pain, change in bowel habits, weight changes, nor any abdominal pain.   Patient denies constipation, vomiting, diarrhea, mucousy stools, loss of appetite, reflux and nausea.    There is not a family history of colon cancer.  His sister has history of Crohn's disease.        Patient Active Problem List    Diagnosis Date Noted   ??? History of kidney stones 05/26/2016   ??? Essential hypertension 05/26/2016   ??? Inguinal hernia of left side without obstruction or gangrene 03/13/2016     Past Medical History:   Diagnosis Date   ??? Hypertension    ??? Kidney stone       No past surgical history on file.   Social History   Substance Use Topics   ??? Smoking status: Current Every Day Smoker     Types: Cigarettes   ??? Smokeless tobacco: Never Used   ??? Alcohol use No      Comment: ocassionally      No family history on file.   Prior to Admission medications    Medication Sig Start Date End Date Taking? Authorizing Provider   PEG 3350-Electrolytes (GO-LYTELY) 236-22.74-6.74 -5.86 gram suspension Take as directed for bowel prep  Indications: BOWEL EVACUATION 06/02/16  Yes Estil Dafthong S Kendyll Huettner, MD   hydroCHLOROthiazide (HYDRODIURIL) 25 mg tablet Take 1 Tab by mouth daily. 05/26/16  Yes Jenene SlickerPravin M Deshmukh, MD   losartan (COZAAR) 50 mg tablet Take 1 Tab by mouth two (2) times a day. 05/26/16  Yes Jenene SlickerPravin M Deshmukh, MD   metoprolol succinate (TOPROL-XL) 50 mg XL tablet Take 1 Tab by mouth daily. 05/26/16  Yes Jenene SlickerPravin M Deshmukh, MD   cyclobenzaprine (FLEXERIL) 10 mg tablet Take 1 Tab by mouth three (3) times daily as needed for Muscle Spasm(s). 03/13/16  Yes Jenene SlickerPravin M Deshmukh, MD    oxyCODONE-acetaminophen (PERCOCET) 5-325 mg per tablet Take 1 Tab by mouth every eight (8) hours as needed for Pain. Max Daily Amount: 3 Tabs. 03/27/16   Jenene SlickerPravin M Deshmukh, MD     Current Outpatient Prescriptions   Medication Sig   ??? PEG 3350-Electrolytes (GO-LYTELY) 236-22.74-6.74 -5.86 gram suspension Take as directed for bowel prep  Indications: BOWEL EVACUATION   ??? hydroCHLOROthiazide (HYDRODIURIL) 25 mg tablet Take 1 Tab by mouth daily.   ??? losartan (COZAAR) 50 mg tablet Take 1 Tab by mouth two (2) times a day.   ??? metoprolol succinate (TOPROL-XL) 50 mg XL tablet Take 1 Tab by mouth daily.   ??? cyclobenzaprine (FLEXERIL) 10 mg tablet Take 1 Tab by mouth three (3) times daily as needed for Muscle Spasm(s).   ??? oxyCODONE-acetaminophen (PERCOCET) 5-325 mg per tablet Take 1 Tab by mouth every eight (8) hours as needed for Pain. Max Daily Amount: 3 Tabs.     No current facility-administered medications for this visit.      Allergies   Allergen Reactions   ??? Lisinopril Other (comments)     Sore throat        Review of Systems:  A comprehensive review of systems was negative except for that written in the History of Present Illness.    Objective:     Visit Vitals   ??? BP (!) 175/115 (BP 1 Location: Right arm, BP Patient Position: Sitting)   ??? Pulse (!) 112   ??? Temp 97.6 ??F (36.4 ??C) (Oral)   ??? Resp 18   ??? Ht 5\' 5"  (1.651 m)   ??? Wt 96.7 kg (213 lb 3.2 oz)   ??? SpO2 98%   ??? BMI 35.48 kg/m2        Physical Exam:   GENERAL: alert, cooperative, no distress, appears stated age  LYMPHATIC: Cervical, supraclavicular, and axillary nodes normal.   THROAT & NECK: normal and no erythema or exudates noted.   LUNG: clear to auscultation bilaterally  HEART: regular rate and rhythm, S1, S2 normal, no murmur, click, rub or gallop  ABDOMEN: soft, non-tender. Bowel sounds normal. No masses,  no organomegaly  EXTREMITIES:  extremities normal, atraumatic, no cyanosis or edema  SKIN: Normal.  NEUROLOGIC: negative  PSYCHIATRIC: non focal           Assessment:     Brandon Alvarado is a 40 y.o. male who requires colonoscopy for chronic rectal bleeding symptoms.      Plan:     1. I recommend proceeding with colonoscopy.  The patient was in full agreement and was eager to proceed.    2. I discussed the details of the colonoscopy procedure.  The risks of colonoscopy were discussed including colon injury/perforation, anesthesia issues, bleeding, and the possibility of incomplete examination.  The patient was willing to accept these risks and proceed with the examination.  All questions were answered to the patient's satisfaction.     3. The patient was provided with the instructions in preparation for the colonoscopy procedure including the bowel prep recommendations.         Thank you for allowing me to participate in the patient's care.         Estil Dafthong S. Keiston Manley, MD, FACS, FASCRS  Colon and Rectal Surgery  [facility]  Office [phone]  Fax     [phone]  06/02/2016  4:26 PM

## 2016-06-11 NOTE — Telephone Encounter (Signed)
Metoprolol Succinate 50 mg tabs is not approved by insurance. Please rx Atenolol, Atenolol/Chlorthalidone, Bisoprolol/HCTZ, Carvedilol, Lavetalol HCI, Metoprolol Tartrate, Nadolol/Bendroflumethiazide, Propranolol solution/table, Propranolol/HCTZ, Sorine, Sotalol, and Sotalol AF. Please rx one of the alternatives.

## 2016-06-12 ENCOUNTER — Encounter

## 2016-06-12 MED ORDER — METOPROLOL TARTRATE 25 MG TAB
25 mg | ORAL_TABLET | Freq: Two times a day (BID) | ORAL | 3 refills | Status: DC
Start: 2016-06-12 — End: 2016-06-26

## 2016-06-13 NOTE — Telephone Encounter (Signed)
Per provider. Metoprolol tartrate prescribed . This encounter will be closed.

## 2016-06-26 ENCOUNTER — Ambulatory Visit
Admit: 2016-06-26 | Discharge: 2016-06-26 | Payer: PRIVATE HEALTH INSURANCE | Attending: Internal Medicine | Primary: Nurse Practitioner

## 2016-06-26 DIAGNOSIS — I10 Essential (primary) hypertension: Secondary | ICD-10-CM

## 2016-06-26 MED ORDER — METOPROLOL TARTRATE 50 MG TAB
50 mg | ORAL_TABLET | Freq: Two times a day (BID) | ORAL | 3 refills | Status: DC
Start: 2016-06-26 — End: 2017-12-10

## 2016-06-26 NOTE — Progress Notes (Signed)
Brandon Alvarado is a 40 y.o.  male and presents with     Chief Complaint   Patient presents with   ??? Hypertension   ??? Rectal Bleeding       Pt is taking losartan and HCTZ but not metoprolol.  Pt does get rectal bleeding occasionally , has appt with DR Derrill Memo.  No chest pain, SOB,    Past Medical History:   Diagnosis Date   ??? Hypertension    ??? Kidney stone      No past surgical history on file.  Current Outpatient Prescriptions   Medication Sig   ??? metoprolol tartrate (LOPRESSOR) 50 mg tablet Take 1 Tab by mouth two (2) times a day.   ??? hydroCHLOROthiazide (HYDRODIURIL) 25 mg tablet Take 1 Tab by mouth daily.   ??? losartan (COZAAR) 50 mg tablet Take 1 Tab by mouth two (2) times a day.     No current facility-administered medications for this visit.      Health Maintenance   Topic Date Due   ??? Pneumococcal 19-64 Medium Risk (1 of 1 - PPSV23) 09/15/1994   ??? DTaP/Tdap/Td series (1 - Tdap) 09/15/1996   ??? INFLUENZA AGE 9 TO ADULT  05/13/2016       There is no immunization history on file for this patient.  No LMP for male patient.        Allergies and Intolerances:   Allergies   Allergen Reactions   ??? Lisinopril Other (comments)     Sore throat       Family History:   No family history on file.    Social History:   He  reports that he has been smoking Cigarettes.  He has never used smokeless tobacco.  He  reports that he does not drink alcohol.            Review of Systems:   General: negative for - chills, fatigue, fever, weight change  Psych: negative for - anxiety, depression, irritability or mood swings  ENT: negative for - headaches, hearing change, nasal congestion, oral lesions, sneezing or sore throat  Heme/ Lymph: negative for - bleeding problems, bruising, pallor or swollen lymph nodes  Endo: negative for - hot flashes, polydipsia/polyuria or temperature intolerance  Resp: negative for - cough, shortness of breath or wheezing  CV: negative for - chest pain, edema or palpitations   GI: negative for - abdominal pain, change in bowel habits, constipation, diarrhea or nausea/vomiting  GU: negative for - dysuria, hematuria, incontinence, pelvic pain or vulvar/vaginal symptoms  MSK: negative for - joint pain, joint swelling or muscle pain  Neuro: negative for - confusion, headaches, seizures or weakness  Derm: negative for - dry skin, hair changes, rash or skin lesion changes          Physical:   Vitals:   Vitals:    06/26/16 1000   BP: (!) 189/129   Pulse: 96   Resp: 15   Temp: 97 ??F (36.1 ??C)   TempSrc: Oral   SpO2: 98%   Weight: 221 lb (100.2 kg)   Height: 5\' 5"  (1.651 m)           Exam:   HEENT- atraumatic,normocephalic, awake, oriented, well nourished  Neck - supple,no enlarged lymph nodes, no JVD, no thyromegaly  Chest- CTA, no rhonchi, no crackles  Heart- rrr, no murmurs / gallop/rub  Abdomen- soft,BS+,NT, no hepatosplenomegaly  Ext - no c/c/edema   Neuro- no focal deficits.Power 5/5 all extremities  Skin -  warm,dry, no obvious rashes.          Review of Data:   LABS:   Lab Results   Component Value Date/Time    WBC 4.9 03/05/2016 09:00 AM    HGB 14.5 03/05/2016 09:00 AM    HCT 42.5 03/05/2016 09:00 AM    PLATELET 230 03/05/2016 09:00 AM     Lab Results   Component Value Date/Time    Sodium 139 03/05/2016 09:00 AM    Potassium 3.9 03/05/2016 09:00 AM    Chloride 106 03/05/2016 09:00 AM    CO2 24 03/05/2016 09:00 AM    Glucose 138 03/05/2016 09:00 AM    BUN 10 03/05/2016 09:00 AM    Creatinine 1.12 03/05/2016 09:00 AM     No results found for: CHOL, CHOLX, CHLST, CHOLV, HDL, LDL, LDLC, DLDLP, TGLX, TRIGL, TRIGP  No results found for: GPT        Impression / Plan:        ICD-10-CM ICD-9-CM    1. Essential hypertension I10 401.9 metoprolol tartrate (LOPRESSOR) 50 mg tablet     Low salt diet.  Blood presure monitor.    Explained to patient risk benefits of the medications.Advised patient to stop meds if having any side effects.Pt verbalized understanding of the instructions.     I have discussed the diagnosis with the patient and the intended plan as seen in the above orders.  The patient has received an after-visit summary and questions were answered concerning future plans.  I have discussed medication side effects and warnings with the patient as well. I have reviewed the plan of care with the patient, accepted their input and they are in agreement with the treatment goals.     Reviewed plan of care. Patient has provided input and agrees with goals.    Follow-up Disposition: Not on File    Jenene Slicker, MD

## 2016-06-26 NOTE — Progress Notes (Signed)
1. Have you been to the ER, urgent care clinic since your last visit?  Hospitalized since your last visit?No    2. Have you seen or consulted any other health care providers outside of the [facility] since your last visit?  Include any pap smears or colon screening. No

## 2016-06-27 ENCOUNTER — Inpatient Hospital Stay: Payer: MEDICAID

## 2016-06-27 MED ORDER — FLUMAZENIL 0.1 MG/ML IV SOLN
0.1 mg/mL | INTRAVENOUS | Status: DC | PRN
Start: 2016-06-27 — End: 2016-06-27

## 2016-06-27 MED ORDER — ATROPINE 0.1 MG/ML SYRINGE
0.1 mg/mL | Freq: Once | INTRAMUSCULAR | Status: DC | PRN
Start: 2016-06-27 — End: 2016-06-27

## 2016-06-27 MED ORDER — SODIUM CHLORIDE 0.9 % IJ SYRG
INTRAMUSCULAR | Status: DC | PRN
Start: 2016-06-27 — End: 2016-06-27

## 2016-06-27 MED ORDER — SODIUM CHLORIDE 0.9 % IV
INTRAVENOUS | Status: DC
Start: 2016-06-27 — End: 2016-06-27

## 2016-06-27 MED ORDER — MIDAZOLAM 1 MG/ML IJ SOLN
1 mg/mL | INTRAMUSCULAR | Status: DC | PRN
Start: 2016-06-27 — End: 2016-06-27

## 2016-06-27 MED ORDER — EPINEPHRINE 0.1 MG/ML SYRINGE
0.1 mg/mL | Freq: Once | INTRAMUSCULAR | Status: DC | PRN
Start: 2016-06-27 — End: 2016-06-27

## 2016-06-27 MED ORDER — MIDAZOLAM 1 MG/ML IJ SOLN
1 mg/mL | INTRAMUSCULAR | Status: DC
Start: 2016-06-27 — End: 2016-06-27

## 2016-06-27 MED ORDER — MEPERIDINE (PF) 100 MG/ML INJ SOLUTION
100 mg/ml | INTRAMUSCULAR | Status: DC | PRN
Start: 2016-06-27 — End: 2016-06-27
  Administered 2016-06-27: 17:00:00 via INTRAVENOUS

## 2016-06-27 MED ORDER — MEPERIDINE (PF) 100 MG/ML INJ SOLUTION
100 mg/ml | INTRAMUSCULAR | Status: DC | PRN
Start: 2016-06-27 — End: 2016-06-27

## 2016-06-27 MED ORDER — NALOXONE 0.4 MG/ML INJECTION
0.4 mg/mL | INTRAMUSCULAR | Status: DC | PRN
Start: 2016-06-27 — End: 2016-06-27

## 2016-06-27 MED ORDER — SIMETHICONE 40 MG/0.6 ML ORAL DROPS, SUSP
40 mg/0.6 mL | ORAL | Status: DC | PRN
Start: 2016-06-27 — End: 2016-06-27

## 2016-06-27 MED ORDER — MIDAZOLAM 1 MG/ML IJ SOLN
1 mg/mL | INTRAMUSCULAR | Status: DC | PRN
Start: 2016-06-27 — End: 2016-06-27
  Administered 2016-06-27 (×3): via INTRAVENOUS

## 2016-06-27 MED ORDER — MEPERIDINE (PF) 100 MG/ML INJ SOLUTION
100 mg/ml | INTRAMUSCULAR | Status: DC
Start: 2016-06-27 — End: 2016-06-27

## 2016-06-27 MED FILL — SODIUM CHLORIDE 0.9 % IV: INTRAVENOUS | Qty: 1000

## 2016-06-27 MED FILL — DEMEROL (PF) 100 MG/ML INJECTION SYRINGE: 100 mg/mL | INTRAMUSCULAR | Qty: 2

## 2016-06-27 MED FILL — SIMETHICONE 40 MG/0.6 ML ORAL DROPS, SUSP: 40 mg/0.6 mL | ORAL | Qty: 1.2

## 2016-06-27 MED FILL — BD POSIFLUSH NORMAL SALINE 0.9 % INJECTION SYRINGE: INTRAMUSCULAR | Qty: 10

## 2016-06-27 MED FILL — MIDAZOLAM 1 MG/ML IJ SOLN: 1 mg/mL | INTRAMUSCULAR | Qty: 10

## 2016-06-27 NOTE — Procedures (Signed)
Colonoscopy Procedure Note      Brandon Alvarado  26-Apr-1976  [id number]                Date of Procedure: 06/27/2016    Indications:    Hematochezia/melena     Preoperative diagnosis: rectal bleeding  k62.5      Postoperative diagnosis: diverticulosis, hemorrhoid disease    Title of Procedure:  Colonoscopy, diagnostic    Operator:  Estil Daft, MD    Assistant(s): Endoscopy Technician-1: Octavio Graves  Endoscopy RN-1: Claiborne Billings, RN    Referring Source:  Jenene Slicker, MD    Sedation:  Demerol 50 mg IV,  Versed 4 mg IV      ASA Class: ASA 2 - Mild systemic disease       Procedure Details:    Prior to the procedure, a history and physical were performed. The patient???s medications, allergies and sensitivities were reviewed and all questions were answered.  After informed consent was obtained for the procedure, with all risks and benefits of procedure explained. The patient was taken to the endoscopy suite and placed in the left lateral decubitus position.  Patient identification and proposed procedure were verified prior to the procedure by the nurse and I.    Following the  satisfactory administration of sedation,  the anus was inspected and appeared abnormal with findings of prolapsed external and internal hemorrhoid disease in the right anterior and posterior quadrants.  Digital rectal examination revealed increased sphincter tone and squeeze pressure.  Palpation revealed No Masses.    Next the Olympus video colonoscope was introduced through the anus and advanced to cecum, which was identified by the ileocecal valve and appendiceal orifice, terminal ileum.  The quality of preparation was good.  The terminal ileum was visualized.  The colonoscope was then slowly withdrawn and the mucosa carefully examined for any abnormalities.  Cecal withdrawl time was greater than six minutes. The patient tolerated the procedure well.      Findings:   Rectum: normal  Sigmoid: normal   Descending Colon: normal  Transverse Colon: mild diverticulosis;  Ascending Colon: mild diverticulosis;  Cecum: mild diverticulosis;  Terminal Ileum: normal    Interventions:  none    Specimen Removed: * No specimens in log *     Complications: None.     EBL:  None.    Impression:    diverticulosis, hemorrhoid disease    Recommendations: -Follow up in the office as scheduled.  -Repeat colonoscopy in 10 years   Resume normal medication(s).     Discharge Disposition:  Home in the company of a driver when able to ambulate.        Estil Daft, MD, FACS, FASCRS  Colon and Rectal Surgery  [facility]  Office [phone]  Fax     [phone]  06/27/2016  1:56 PM       [facility]  [address], [address]  [place], [place] [address]

## 2016-06-27 NOTE — Interval H&P Note (Signed)
H&P Update:  Brandon Alvarado was seen and examined.  History and physical has been reviewed. The patient has been examined. There have been no significant clinical changes since the completion of the originally dated History and Physical.    Signed By: Estil Dafthong S Edith Groleau, MD     June 27, 2016 12:20 PM

## 2016-06-27 NOTE — Procedures (Signed)
Procedures by Derrill Memo  S at 06/27/16 1354                Author: Derrill Memo S  Service: Colon and Rectal Surgery  Author Type: Physician       Filed: 06/27/16 1359  Date of Service: 06/27/16 1354  Status: Signed          Editor: Estil Daft            Pre-procedure Diagnoses        1. Rectal bleeding [K62.5]                           Post-procedure Diagnoses        1. Diverticulosis of large intestine without hemorrhage [K57.30]        2. Internal and external prolapsed hemorrhoids [K64.8]                           Procedures        1. COLONOSCOPY,DIAGNOSTIC [[id number]]                                                 Colonoscopy Procedure Note         Brandon Alvarado   06-26-1976   [id number]                   Date of Procedure: 06/27/2016      Indications:    Hematochezia/melena        Preoperative diagnosis: rectal bleeding  k62.5        Postoperative diagnosis: diverticulosis, hemorrhoid disease      Title of Procedure:  Colonoscopy, diagnostic      Operator:  Estil Daft, MD      Assistant(s): Endoscopy Technician-1: Octavio Graves   Endoscopy RN-1: Claiborne Billings, RN      Referring Source:  Jenene Slicker, MD      Sedation:  Demerol 50 mg IV,  Versed 4 mg IV        ASA Class: ASA 2 - Mild systemic disease          Procedure Details:     Prior to the procedure, a history and physical were performed. The patients medications, allergies and sensitivities were reviewed and all questions were answered.  After informed  consent was obtained for the procedure, with all risks and benefits of procedure explained. The patient was taken to the endoscopy suite  and placed in the left lateral decubitus position.  Patient identification and proposed procedure were verified prior to the procedure by the nurse and I.      Following the  satisfactory administration  of sedation,  the anus was inspected and appeared abnormal with findings of prolapsed external and internal hemorrhoid disease in the right anterior  and posterior quadrants.   Digital rectal examination revealed increased sphincter tone and squeeze pressure.  Palpation revealed No Masses.      Next the Olympus video colonoscope was introduced through the anus and advanced  to cecum, which was identified by the ileocecal valve and appendiceal orifice, terminal ileum.  The quality of preparation was  good.  The terminal ileum was visualized.  The  colonoscope was then slowly withdrawn and the mucosa  carefully examined for  any abnormalities.  Cecal withdrawl time was greater than six  minutes. The patient tolerated the procedure well.         Findings:    Rectum: normal   Sigmoid: normal   Descending Colon: normal   Transverse Colon: mild diverticulosis ;   Ascending Colon: mild diverticulosis ;   Cecum: mild diverticulosis ;   Terminal Ileum: normal      Interventions:  none      Specimen Removed: * No specimens in log *       Complications: None.       EBL:  None.      Impression:    diverticulosis, hemorrhoid disease      Recommendations: -Follow up in the office as scheduled.   -Repeat colonoscopy in 10 years   Resume normal medication(s).       Discharge Disposition:  Home in the company of a driver when able to ambulate.            Estil Dafthong S. Lee, MD, FACS, FASCRS   Colon and Rectal Surgery   [facility]   Office [phone]   Fax     [phone]   06/27/2016  1:56 PM          [facility]   [address], [address]   [place], [place] [address]

## 2016-07-25 ENCOUNTER — Encounter: Attending: Internal Medicine | Primary: Nurse Practitioner

## 2016-07-28 ENCOUNTER — Ambulatory Visit
Admit: 2016-07-28 | Discharge: 2016-07-28 | Payer: PRIVATE HEALTH INSURANCE | Attending: Surgery | Primary: Nurse Practitioner

## 2016-07-28 DIAGNOSIS — K648 Other hemorrhoids: Secondary | ICD-10-CM

## 2016-07-28 NOTE — H&P (View-Only) (Signed)
[facility]  [address], [address]  [place] [address]            Colon and Rectal Surgery    Patient: Brandon Alvarado  MRN: [id number]  SSN: [id number]   Date of Birth: 02/18/1976  Age: 39 y.o.  Sex: male       Brandon Alvarado is a 39 y.o. male who presented for colonoscopy exam for repeated episodes of bright red rectal bleeding often mixed with stool.  This has been on and off for the past 15 years.    He underwent a colonoscopy exam on 06/27/2016 which showed symptomatic prolapsed external and internal hemorrhoid disease in the right anterior and posterior quadrants as well mild diverticulosis of the left colon.    The patient wants to proceed with the surgical hemorrhoidectomy.        Past Medical History:   Diagnosis Date   ??? Hypertension    ??? Kidney stone      Past Surgical History:   Procedure Laterality Date   ??? COLONOSCOPY N/A 06/27/2016    COLONOSCOPY performed by Keonna Raether S Kaz Auld, MD at [facility]   ??? HX ORTHOPAEDIC      left ankle, hardware in place     Allergies   Allergen Reactions   ??? Lisinopril Other (comments)     Sore throat     Current Outpatient Prescriptions   Medication Sig Dispense Refill   ??? HYDROcodone-acetaminophen (XODOL) 7.5-300 mg tablet Take  by mouth.     ??? amoxicillin 500 mg tab Take  by mouth.     ??? metoprolol tartrate (LOPRESSOR) 50 mg tablet Take 1 Tab by mouth two (2) times a day. 60 Tab 3   ??? hydroCHLOROthiazide (HYDRODIURIL) 25 mg tablet Take 1 Tab by mouth daily. 30 Tab 3   ??? losartan (COZAAR) 50 mg tablet Take 1 Tab by mouth two (2) times a day. 60 Tab 3         Physical Examination    Vitals:    07/28/16 1438   BP: (!) 174/91   Pulse: 95   Resp: 18   Temp: 98 ??F (36.7 ??C)   TempSrc: Oral   SpO2: 96%   Weight: 97.1 kg (214 lb)   Height: 5' 5" (1.651 m)        Physical Exam:   GENERAL: alert, cooperative, no distress, appears stated age  LUNG: clear to auscultation bilaterally  HEART: regular rate and rhythm, S1, S2 normal, no murmur, click, rub or  gallop  ABDOMEN: soft, non-tender. Bowel sounds normal. No masses,  no organomegaly  EXTREMITIES:  extremities normal, atraumatic, no cyanosis or edema      Assessment and Plan:    Brandon Alvarado is a 39 y.o. male who presents with symptomatic prolapsed external and internal hemorrhoid disease.    I discussed the details of the procedure as well as the risks of surgery including bleeding, infection, pain, anesthesia complications, possibility of recurrent disease, and the possibility of anal incontinence with any anal surgery.  The patient is willing to accept the risks and would like to proceed with the surgery.             Brandon Alvarado S. Stellarose Cerny, MD, FACS, FASCRS  Colon and Rectal Surgery  [facility]  Office [phone]  Fax     [phone]  07/28/2016  5:12 PM     [facility]  [facility]

## 2016-07-28 NOTE — Patient Instructions (Addendum)
If you have any questions or concerns about today's appointment, the verbal and/or written instructions you were given for follow up care, please call our office at [phone].    [facility]  [address], [address]  [place], [place] [address]    [phone] office  [phone] fax      .Marland Kitchen.    [facility]  [address], [address]  [place], [place] [address]    [phone] office main line  [phone] main fax                  PATIENT PRE AND POST OPERATIVE INSTRUCTIONS     Hospital: [facility]     [address] [place], [place] [address]  [phone]    Before Surgery Instructions:   1) You must have someone available to drive you to and from your procedure and stay with you for the first 24 hours.  2) It is very important that you have nothing to eat or drink after midnight the night before your surgery. This includes chewing gum or sucking on hard candy.  Take only heart, blood pressure and cholesterol medications the morning of surgery with only a sip of water.  3) Please stop taking Plavix 10 days prior to your surgery. Stop taking Coumadin 5 days prior to your surgery. Stop taking all Aspirin or Aspirin containing products 7 days prior to your surgery. Stop taking Advil, Motrin, Aleve, and etc. 3 days prior to your surgery.  4) If you take any diabetic medications please consult with your primary care physician on how to take them on the day of your surgery  5) Please stop all Herbal products 2 weeks prior to your surgery.  6) Please arrive at the hospital 1 ?? hours prior to your surgery, unless you have been otherwise instructed.  7) Patients having an operation on their colon will be given a separate instruction sheet on their Bowel Prep.  8) For any pre-operative work up check in at the main entrance to [facility], and then go to Patient Registration. These  studies are done on a walk in basis they are open from 7:00am to 5:00pm Monday through Friday.  9) Please wash your surgical site the morning of your surgery with soap and water.  10) If you are of child bearing age you will have pregnancy test done the morning of your surgery as soon as you arrive.    Surgery Date and Time:      NOvember 03,2017             at   07:30       am          [facility], enter through the Emergency Room entrance and go up to the second floor. Please check in by    05:30     am  the day of your surgery.     You may contact Judeth CornfieldStephanie with any questions at [phone].    After Surgery Instructions:     You will need to be seen in the office for a follow-up visit 7-14 days after your surgery. Please call after you have had the procedure to make this appointment.  Unless otherwise instructed, you may remove your outer bandage and shower 48 hours after your surgery.  If you develop a fever greater than 101, have any significant drainage, bleeding, swelling and/or  pus of the wound. Please call our office immediately.

## 2016-07-28 NOTE — Progress Notes (Signed)
1. Have you been to the ER, urgent care clinic since your last visit?  Hospitalized since your last visit?No    2. Have you seen or consulted any other health care providers outside of the [facility] since your last visit?  Include any pap smears or colon screening. Yes Teeth pulled at dentist.      Patient presents to schedule hemorrhoidectomy.  His colonoscopy was 06/27/16.

## 2016-07-28 NOTE — Progress Notes (Signed)
[facility]  [address], [address]  [place], [place] [address]            Colon and Rectal Surgery    Patient: Brandon Alvarado  MRN: [id number]  SSN: [id number]   Date of Birth: 07/13/1976  Age: 40 y.o.  Sex: male       Brandon Alvarado is a 40 y.o. male who presented for colonoscopy exam for repeated episodes of bright red rectal bleeding often mixed with stool.  This has been on and off for the past 15 years.    He underwent a colonoscopy exam on 06/27/2016 which showed symptomatic prolapsed external and internal hemorrhoid disease in the right anterior and posterior quadrants as well mild diverticulosis of the left colon.    The patient wants to proceed with the surgical hemorrhoidectomy.        Past Medical History:   Diagnosis Date   ??? Hypertension    ??? Kidney stone      Past Surgical History:   Procedure Laterality Date   ??? COLONOSCOPY N/A 06/27/2016    COLON[facility]ong S Brixton Franko[facility]   ??? HX ORTHOPAEDIC      left ankle, hardware in place     Allergies   Allergen Reactions   ??? Lisinopril Other (comments)     Sore throat     Current Outpatient Prescriptions   Medication Sig Dispense Refill   ??? HYDROcodone-acetaminophen (XODOL) 7.5-300 mg tablet Take  by mouth.     ??? amoxicillin 500 mg tab Take  by mouth.     ??? metoprolol tartrate (LOPRESSOR) 50 mg tablet Take 1 Tab by mouth two (2) times a day. 60 Tab 3   ??? hydroCHLOROthiazide (HYDRODIURIL) 25 mg tablet Take 1 Tab by mouth daily. 30 Tab 3   ??? losartan (COZAAR) 50 mg tablet Take 1 Tab by mouth two (2) times a day. 60 Tab 3         Physical Examination    Vitals:    07/28/16 1438   BP: (!) 174/91   Pulse: 95   Resp: 18   Temp: 98 ??F (36.7 ??C)   TempSrc: Oral   SpO2: 96%   Weight: 97.1 kg (214 lb)   Height: 5\' 5"  (1.651 m)        Physical Exam:   GENERAL: alert, cooperative, no distress, appears stated age  LUNG: clear to auscultation bilaterally  HEART: regular rate and rhythm, S1, S2 normal, no murmur, click, rub or  gallop  ABDOMEN: soft, non-tender. Bowel sounds normal. No masses,  no organomegaly  EXTREMITIES:  extremities normal, atraumatic, no cyanosis or edema      Assessment and Plan:    Brandon Alvarado is a 40 y.o. male who presents with symptomatic prolapsed external and internal hemorrhoid disease.    I discussed the details of the procedure as well as the risks of surgery including bleeding, infection, pain, anesthesia complications, possibility of recurrent disease, and the possibility of anal incontinence with any anal surgery.  The patient is willing to accept the risks and would like to proceed with the surgery.             Estil Dafthong S. Pauleen Goleman, MD, FACS, FASCRS  Colon and Rectal Surgery  [facility]  Office [phone]  Fax     [phone]  07/28/2016  5:12 PM     [facility]  [facility]

## 2016-07-29 NOTE — Progress Notes (Signed)
Pt was a No Show 07/25/16. Letter #1 sent 07/29/16. DMA/ms.

## 2016-08-14 ENCOUNTER — Inpatient Hospital Stay: Payer: MEDICAID

## 2016-08-14 LAB — POTASSIUM: Potassium: 4.8 mmol/L (ref 3.5–5.5)

## 2016-08-14 MED ORDER — MIDAZOLAM 1 MG/ML IJ SOLN
1 mg/mL | INTRAMUSCULAR | Status: AC
Start: 2016-08-14 — End: ?

## 2016-08-14 MED ORDER — LIDOCAINE (PF) 20 MG/ML (2 %) IJ SOLN
20 mg/mL (2 %) | INTRAMUSCULAR | Status: DC | PRN
Start: 2016-08-14 — End: 2016-08-14
  Administered 2016-08-14: 16:00:00 via INTRAVENOUS

## 2016-08-14 MED ORDER — PROPOFOL 10 MG/ML IV EMUL
10 mg/mL | INTRAVENOUS | Status: DC | PRN
Start: 2016-08-14 — End: 2016-08-14
  Administered 2016-08-14 (×2): via INTRAVENOUS

## 2016-08-14 MED ORDER — LIDOCAINE (PF) 20 MG/ML (2 %) IJ SOLN
20 mg/mL (2 %) | INTRAMUSCULAR | Status: AC
Start: 2016-08-14 — End: ?

## 2016-08-14 MED ORDER — INSULIN LISPRO 100 UNIT/ML INJECTION
100 unit/mL | Freq: Once | SUBCUTANEOUS | Status: DC
Start: 2016-08-14 — End: 2016-08-14

## 2016-08-14 MED ORDER — LACTATED RINGERS IV
INTRAVENOUS | Status: DC
Start: 2016-08-14 — End: 2016-08-14
  Administered 2016-08-14: 14:00:00 via INTRAVENOUS

## 2016-08-14 MED ORDER — SODIUM CHLORIDE 0.9 % IRRIGATION SOLN
0.9 % | Status: DC | PRN
Start: 2016-08-14 — End: 2016-08-14
  Administered 2016-08-14: 16:00:00

## 2016-08-14 MED ORDER — FENTANYL CITRATE (PF) 50 MCG/ML IJ SOLN
50 mcg/mL | INTRAMUSCULAR | Status: AC
Start: 2016-08-14 — End: ?

## 2016-08-14 MED ORDER — BUPIVACAINE (PF) 0.25 % (2.5 MG/ML) IJ SOLN
0.25 % (2.5 mg/mL) | INTRAMUSCULAR | Status: DC | PRN
Start: 2016-08-14 — End: 2016-08-14
  Administered 2016-08-14: 16:00:00 via SUBCUTANEOUS

## 2016-08-14 MED ORDER — FAMOTIDINE 20 MG TAB
20 mg | Freq: Once | ORAL | Status: AC
Start: 2016-08-14 — End: 2016-08-14
  Administered 2016-08-14: 14:00:00 via ORAL

## 2016-08-14 MED ORDER — FENTANYL CITRATE (PF) 50 MCG/ML IJ SOLN
50 mcg/mL | INTRAMUSCULAR | Status: DC | PRN
Start: 2016-08-14 — End: 2016-08-14
  Administered 2016-08-14 (×3): via INTRAVENOUS

## 2016-08-14 MED ORDER — LIDOCAINE-EPINEPHRINE 1 %-1:100,000 IJ SOLN
1 %-:00,000 | Freq: Once | INTRAMUSCULAR | Status: AC
Start: 2016-08-14 — End: 2016-08-14
  Administered 2016-08-14: 16:00:00 via INTRADERMAL

## 2016-08-14 MED ORDER — LIDOCAINE HCL 1 % (10 MG/ML) IJ SOLN
10 mg/mL (1 %) | INTRAMUSCULAR | Status: AC
Start: 2016-08-14 — End: ?

## 2016-08-14 MED ORDER — BUPIVACAINE (PF) 0.25 % (2.5 MG/ML) IJ SOLN
0.25 % (2.5 mg/mL) | INTRAMUSCULAR | Status: AC
Start: 2016-08-14 — End: ?

## 2016-08-14 MED ORDER — LABETALOL 5 MG/ML IV SOLN
5 mg/mL | INTRAVENOUS | Status: DC | PRN
Start: 2016-08-14 — End: 2016-08-14
  Administered 2016-08-14 (×3): via INTRAVENOUS

## 2016-08-14 MED ORDER — OXYCODONE-ACETAMINOPHEN 5 MG-325 MG TAB
5-325 mg | ORAL_TABLET | ORAL | 0 refills | Status: DC | PRN
Start: 2016-08-14 — End: 2016-08-27

## 2016-08-14 MED ORDER — BACITRACIN 500 UNIT/G OINTMENT
500 unit/gram | CUTANEOUS | Status: AC
Start: 2016-08-14 — End: ?

## 2016-08-14 MED ORDER — MIDAZOLAM 1 MG/ML IJ SOLN
1 mg/mL | INTRAMUSCULAR | Status: DC | PRN
Start: 2016-08-14 — End: 2016-08-14
  Administered 2016-08-14: 15:00:00 via INTRAVENOUS

## 2016-08-14 MED ORDER — PROPOFOL 10 MG/ML IV EMUL
10 mg/mL | INTRAVENOUS | Status: AC
Start: 2016-08-14 — End: ?

## 2016-08-14 MED ORDER — OXYCODONE-ACETAMINOPHEN 5 MG-325 MG TAB
5-325 mg | ORAL | Status: DC | PRN
Start: 2016-08-14 — End: 2016-08-14
  Administered 2016-08-14: 17:00:00 via ORAL

## 2016-08-14 MED ORDER — ONDANSETRON (PF) 4 MG/2 ML INJECTION
4 mg/2 mL | INTRAMUSCULAR | Status: DC | PRN
Start: 2016-08-14 — End: 2016-08-14
  Administered 2016-08-14: 16:00:00 via INTRAVENOUS

## 2016-08-14 MED FILL — FAMOTIDINE 20 MG TAB: 20 mg | ORAL | Qty: 1

## 2016-08-14 MED FILL — FENTANYL CITRATE (PF) 50 MCG/ML IJ SOLN: 50 mcg/mL | INTRAMUSCULAR | Qty: 2

## 2016-08-14 MED FILL — MIDAZOLAM 1 MG/ML IJ SOLN: 1 mg/mL | INTRAMUSCULAR | Qty: 2

## 2016-08-14 MED FILL — BACITRACIN 500 UNIT/G OINTMENT: 500 unit/gram | CUTANEOUS | Qty: 14

## 2016-08-14 MED FILL — LIDOCAINE (PF) 20 MG/ML (2 %) IJ SOLN: 20 mg/mL (2 %) | INTRAMUSCULAR | Qty: 5

## 2016-08-14 MED FILL — XYLOCAINE WITH EPINEPHRINE 1 %-1:100,000 INJECTION SOLUTION: 1 %-:00,000 | INTRAMUSCULAR | Qty: 30

## 2016-08-14 MED FILL — LIDOCAINE HCL 1 % (10 MG/ML) IJ SOLN: 10 mg/mL (1 %) | INTRAMUSCULAR | Qty: 20

## 2016-08-14 MED FILL — BUPIVACAINE (PF) 0.25 % (2.5 MG/ML) IJ SOLN: 0.25 % (2.5 mg/mL) | INTRAMUSCULAR | Qty: 30

## 2016-08-14 MED FILL — LACTATED RINGERS IV: INTRAVENOUS | Qty: 1000

## 2016-08-14 MED FILL — PROPOFOL 10 MG/ML IV EMUL: 10 mg/mL | INTRAVENOUS | Qty: 20

## 2016-08-14 MED FILL — OXYCODONE-ACETAMINOPHEN 5 MG-325 MG TAB: 5-325 mg | ORAL | Qty: 2

## 2016-08-14 NOTE — Interval H&P Note (Signed)
H&P Update:  Brandon Alvarado was seen and examined.  History and physical has been reviewed. The patient has been examined. There have been no significant clinical changes since the completion of the originally dated History and Physical.    Signed By: Estil Dafthong S Emidio Warrell, MD     August 14, 2016 11:05 AM

## 2016-08-14 NOTE — Other (Addendum)
Pt assisted x 2 to restroom and returned to recliner.  Given Crackers and Ginger ale.  Speaking to Dr. Sande RivesBrummett re:  Pain medication.    1316  Pt PO medicated for pain.  Reiterated safety precautions.  Pt gave verbal understanding and demonstration.            Pt's fiancee's and designated ride Holland Falling(Nicole Johnson) ETA 10 mins.  Pt updated.

## 2016-08-14 NOTE — Other (Signed)
pts fiance Holland Fallingicole Johnson will be back Took child to school down the street. [phone]

## 2016-08-14 NOTE — Op Note (Signed)
COLON AND RECTAL SURGERY OPERATIVE NOTE            Procedure Date: 08/14/2016    Indications: Symptomatic hemorrhoid disease    Pre-operative Diagnosis:  Symptomatic hemorrhoid disease    Post-operative Diagnosis:  Symptomatic hemorrhoid disease    Title of Procedure:  Complex 3 Quadrant External and Internal Hemorrhoidectomy     Surgeon(s): Estil Dafthong S Rheagan Nayak, MD    Assistants: Willaim Rayasirc-1: Starr LakeAlessandra C Vellucci  Circ-Relief: Dixon BoosAllyson R Speirs, RN  Scrub Tech-1: Benancio DeedsWilliam Bentley  Scrub Tech-Relief: Earney Malletobert Rubiella  Surg Asst-1: Jenne PaneMeghan M Gawne    Anesthesiologist: Anesthesiologist: Fausto Skillernichard R Brummett, MD  CRNA: Lockie MolaAshley M Buchanan, CRNA; Doneta PublicValerie Ann Blitzer, CRNA  SRNA: Marchelle FolksAlison L Sevier    Anesthesia: MAC    ASA Class: ASA 2 - Mild systemic disease      Indication for surgery:   The patient is a 40 y.o., BLACK OR [place] male who was recently seen in clinic and was noted to have symptomatic hemorrhoid disease. Due to the severity of the disease process, surgical hemorrhoidectomy was discussed for more definitive management. The patient was informed of the indication for the procedure as well as the risk and complications.  I discussed the details of the procedure as well as the risks of surgery including bleeding, infection, pain, anesthesia complications, possibility of recurrent disease, and the possibility of anal incontinence with any anal surgery. The patient demonstrated good understanding of surgical considerations, risks, benefits and alternatives and was willing to accept the risks of surgery. The patient elected to proceed with surgery, and therefore the surgery was scheduled.        Procedure    Findings:  Right anterior quadrant - Severe prolapsed external and internal hemorrhoid disease                    Right posterior quadrant - Severe prolapsed external and internal hemorrhoid disease                    Left lateral quadrant - Moderate prolapsed external and internal hemorrhoid disease     Procedure Details:   The patient was brought to the operating room and placed on the operating room table in the prone position after MAC anesthesia was successfully achieved by the Anesthesiology serrvice. The safety strap was carefully secured and pressure points were carefully padded. The patient was then prepped and draped in the standard sterile fashion. The pneumatic compression boots in the lower extremity were placed prior to surgery. I next injected 20 mL of 1% lidocaine with epinephrine at the operative sites for satisfactory local anesthesia.     With the aid of the Pratt retractor, the anal canal was thoroughly evaluated. The patient had the symptomatic hemorrhoid disease as listed in the Findings section above. No other abnormalities were noted.     I proceeded with the surgical hemorrhoidectomy in the following fashion for the right posterior quadrant hemorrhoid disease. An inverted "V" incision was made sharply encompassing the diseased external hemorrhoidal tissues. This was excised sharply and with the aid of the electrocautery for hemostasis from the subcutaneous external sphincter muscle layer.  In continuity, all of the internal hemorrhoidal tissues were excised from the internal sphincter muscle layer with the use of the Harmonic Focus. Care was taken to avoid any injury to the sphincter muscle layers. The mucosal defect was then closed with a 3-0 Chromic suture in the running locked fashion. The anoderm and the perianal skin defects  were closed with the same suture, but in a simple running fashion. Next the hemorrhoid disease in the right anterior quadrant and left lateral quadrant were surgically excised in similar fashion. The anal canal was then irrigated with sterile saline, and hemostasis was noted to be complete. I next injected approximately 10 mL of 0.25% marcaine at the operative sites for post operative analgesia. A sterile dry dressing was next placed at the anal verge.      The patient tolerated the procedure well and sent to the recovery room in good condition. Needle and sponge counts were correct.       Estimated Blood Loss:  50 mL           Drains: None           Total IV Fluids: 500 mL           Specimens: Sent - Right posterior and anterior, Left lateral Hemorrhoids to Pathology                  Complications: None             Disposition: aroused from sedation, and taken to the recovery room in a stable condition           Condition: good    Surgeon???s Signature:       Estil Dafthong S. Jessie Schrieber, MD, FACS, FASCRS  Colon and Rectal Surgery  [facility]  Office [phone]  Fax     [phone]  08/14/2016  12:40 PM

## 2016-08-14 NOTE — Anesthesia Post-Procedure Evaluation (Signed)
Post-Anesthesia Evaluation and Assessment    Patient: Brandon Alvarado MRN: [id number]  SSN: [id number]    Date of Birth: 07/25/1976  Age: 40 y.o.  Sex: male      Data from PACU flowsheet    Cardiovascular Function/Vital Signs  Visit Vitals   ??? BP 130/69 (BP 1 Location: Right arm, BP Patient Position: At rest)   ??? Pulse 89   ??? Temp 36.6 ??C (97.9 ??F)   ??? Resp 16   ??? Ht 5\' 5"  (1.651 m)   ??? Wt 96 kg (211 lb 9.6 oz)   ??? SpO2 96%   ??? BMI 35.21 kg/m2       Patient is status post MAC anesthesia for Procedure(s):  HEMORRHOIDECTOMY .    Nausea/Vomiting: controlled    Postoperative hydration reviewed and adequate.    Pain:  Pain Scale 1: Numeric (0 - 10) (08/14/16 0947)  Pain Intensity 1: 6 (08/14/16 0947)   Managed      Mental Status and Level of Consciousness: Alert and oriented     Pulmonary Status:   O2 Device: Room air (08/14/16 1241)   Adequate oxygenation and airway patent    Complications related to anesthesia: None    Post-anesthesia assessment completed. No concerns    Signed By: Fausto Skillernichard R Brummett, MD     August 14, 2016

## 2016-08-14 NOTE — Anesthesia Pre-Procedure Evaluation (Signed)
Anesthetic History   No history of anesthetic complications            Review of Systems / Medical History  Patient summary reviewed and pertinent labs reviewed    Pulmonary          Smoker         Neuro/Psych   Within defined limits           Cardiovascular  Within defined limits  Hypertension: well controlled                   GI/Hepatic/Renal  Within defined limits   GERD: well controlled           Endo/Other        Obesity     Other Findings   Comments:   Risk Factors for Postoperative nausea/vomiting:       History of postoperative nausea/vomiting?  NO       Male?  NO       Motion sickness?  NO       Intended opioid administration for postoperative analgesia?  YES      Smoking Abstinence  Current Smoker?   YES  Elective Surgery? YES  Seen preoperatively by anesthesiologist or proxy prior to day of surgery?  YES  Pt abstained from smoking 24 hours prior to anesthesia? NO           Physical Exam    Airway  Mallampati: II  TM Distance: 4 - 6 cm  Neck ROM: normal range of motion   Mouth opening: Normal     Cardiovascular               Dental  No notable dental hx       Pulmonary                 Abdominal  GI exam deferred       Other Findings            Anesthetic Plan    ASA: 3  Anesthesia type: MAC            Anesthetic plan and risks discussed with: Patient

## 2016-08-14 NOTE — Op Note (Signed)
 COLON AND RECTAL SURGERY OPERATIVE NOTE            Procedure Date: 08/14/2016    Indications: Symptomatic hemorrhoid disease    Pre-operative Diagnosis:  Symptomatic hemorrhoid disease    Post-operative Diagnosis:  Symptomatic hemorrhoid disease    Title of Procedure:  Complex 3 Quadrant External and Internal Hemorrhoidectomy     Surgeon(s): Windsor GORMAN Ruth, MD    Assistants: Thora: Keitha BROCKS Vellucci  Circ-Relief: Peyton JONELLE Slot, RN  Scrub Tech-1: Elsie Mains  Scrub Tech-Relief: Lamar Clunes  Surg Asst-1: Gerard CHRISTELLA Large    Anesthesiologist: Anesthesiologist: Charlie JONELLE Jude, MD  CRNA: Rosina CHRISTELLA Reef, CRNA; Berwyn Jenkins Form, CRNA  SRNA: Donald CROME Sevier    Anesthesia: MAC    ASA Class: ASA 2 - Mild systemic disease      Indication for surgery:   The patient is a 40 y.o., BLACK OR [place] male who was recently seen in clinic and was noted to have symptomatic hemorrhoid disease. Due to the severity of the disease process, surgical hemorrhoidectomy was discussed for more definitive management. The patient was informed of the indication for the procedure as well as the risk and complications.  I discussed the details of the procedure as well as the risks of surgery including bleeding, infection, pain, anesthesia complications, possibility of recurrent disease, and the possibility of anal incontinence with any anal surgery. The patient demonstrated good understanding of surgical considerations, risks, benefits and alternatives and was willing to accept the risks of surgery. The patient elected to proceed with surgery, and therefore the surgery was scheduled.        Procedure    Findings:  Right anterior quadrant - Severe prolapsed external and internal hemorrhoid disease                    Right posterior quadrant - Severe prolapsed external and internal hemorrhoid disease                    Left lateral quadrant - Moderate prolapsed external and internal hemorrhoid disease    Procedure  Details:   The patient was brought to the operating room and placed on the operating room table in the prone position after MAC anesthesia was successfully achieved by the Anesthesiology serrvice. The safety strap was carefully secured and pressure points were carefully padded. The patient was then prepped and draped in the standard sterile fashion. The pneumatic compression boots in the lower extremity were placed prior to surgery. I next injected 20 mL of 1% lidocaine with epinephrine at the operative sites for satisfactory local anesthesia.     With the aid of the Pratt retractor, the anal canal was thoroughly evaluated. The patient had the symptomatic hemorrhoid disease as listed in the Findings section above. No other abnormalities were noted.     I proceeded with the surgical hemorrhoidectomy in the following fashion for the right posterior quadrant hemorrhoid disease. An inverted V incision was made sharply encompassing the diseased external hemorrhoidal tissues. This was excised sharply and with the aid of the electrocautery for hemostasis from the subcutaneous external sphincter muscle layer.  In continuity, all of the internal hemorrhoidal tissues were excised from the internal sphincter muscle layer with the use of the Harmonic Focus. Care was taken to avoid any injury to the sphincter muscle layers. The mucosal defect was then closed with a 3-0 Chromic suture in the running locked fashion. The anoderm and the perianal skin defects  were closed with the same suture, but in a simple running fashion. Next the hemorrhoid disease in the right anterior quadrant and left lateral quadrant were surgically excised in similar fashion. The anal canal was then irrigated with sterile saline, and hemostasis was noted to be complete. I next injected approximately 10 mL of 0.25% marcaine at the operative sites for post operative analgesia. A sterile dry dressing was next placed at the anal verge.     The patient  tolerated the procedure well and sent to the recovery room in good condition. Needle and sponge counts were correct.       Estimated Blood Loss:  50 mL           Drains: None           Total IV Fluids: 500 mL           Specimens: Sent - Right posterior and anterior, Left lateral Hemorrhoids to Pathology                  Complications: None             Disposition: aroused from sedation, and taken to the recovery room in a stable condition           Condition: good    Surgeon's Signature:       Windsor CANDIE Ruth, MD, FACS, FASCRS  Colon and Rectal Surgery  [facility]  Office [phone]  Fax     [phone]  08/14/2016  12:40 PM

## 2016-08-15 LAB — EKG, 12 LEAD, INITIAL
Atrial Rate: 78 {beats}/min
Calculated P Axis: 36 degrees
Calculated R Axis: 42 degrees
Calculated T Axis: 13 degrees
Diagnosis: NORMAL
P-R Interval: 160 ms
Q-T Interval: 428 ms
QRS Duration: 108 ms
QTC Calculation (Bezet): 487 ms
Ventricular Rate: 78 {beats}/min

## 2016-08-15 LAB — EKG 12-LEAD
Atrial Rate: 78 {beats}/min
Diagnosis: NORMAL
P Axis: 36 degrees
P-R Interval: 160 ms
Q-T Interval: 428 ms
QRS Duration: 108 ms
QTc Calculation (Bazett): 487 ms
R Axis: 42 degrees
T Axis: 13 degrees
Ventricular Rate: 78 {beats}/min

## 2016-08-18 MED FILL — PROPOFOL 10 MG/ML IV EMUL: 10 mg/mL | INTRAVENOUS | Qty: 576

## 2016-08-18 MED FILL — LABETALOL 5 MG/ML IV SOLN: 5 mg/mL | INTRAVENOUS | Qty: 6

## 2016-08-18 MED FILL — LACTATED RINGERS IV: INTRAVENOUS | Qty: 500

## 2016-08-18 MED FILL — ONDANSETRON (PF) 4 MG/2 ML INJECTION: 4 mg/2 mL | INTRAMUSCULAR | Qty: 2

## 2016-08-18 MED FILL — LIDOCAINE (PF) 20 MG/ML (2 %) IJ SOLN: 20 mg/mL (2 %) | INTRAMUSCULAR | Qty: 1

## 2016-08-18 MED FILL — PROPOFOL 10 MG/ML IV EMUL: 10 mg/mL | INTRAVENOUS | Qty: 5

## 2016-08-25 ENCOUNTER — Encounter: Attending: Surgery | Primary: Nurse Practitioner

## 2016-08-26 ENCOUNTER — Ambulatory Visit
Admit: 2016-08-26 | Discharge: 2016-08-26 | Payer: PRIVATE HEALTH INSURANCE | Attending: Surgery | Primary: Nurse Practitioner

## 2016-08-26 DIAGNOSIS — K648 Other hemorrhoids: Secondary | ICD-10-CM

## 2016-08-26 NOTE — Progress Notes (Signed)
[facility]  [address], [address]  [place], [place] [id number]              Patient: Brandon Alvarado  Admitted: (Not on file) MRN: [id number]     Age:  40 y.o.,      Sex: male    Date of Birth:  09/11/1976       Subjective    Mr.Zingaro is an 40 y.o. male who is status post Complex 3 Quadrant External and Internal Hemorrhoidectomy on 08/14/2016 for severe symptomatic hemorrhoid disease.     He is currently without complaints except for some pain, bleeding and discharge which is improving for all these symptoms.  His bowels are regular, and the patient denies fever.        Objective    Vitals:    08/26/16 1042   BP: 160/89   Pulse: 85   Resp: 18   Temp: 96.5 ??F (35.8 ??C)   TempSrc: Oral   SpO2: 100%   Weight: 96.6 kg (213 lb)   Height: 5\' 5"  (1.651 m)   PainSc:   8   PainLoc: Rectum       Physical Exam:  General: alert, cooperative, no distress, appears stated age  Anorectal:  With the patient in the prone position the anus appeared within normal limits with healing operative sites.  Digital rectal examination revealed increased sphincter tone and squeeze pressure.  Palpation revealed No Masses.         Assessment / Plan    [name] is Doing well postoperatively.    The patient will continue the local wound care until the operative site has completely healed.    Return in 2 weeks for follow up.          Estil Dafthong S. Liora Myles, MD, FACS, FASCRS  Colon and Rectal Surgery  [facility]  Office [phone]  Fax     [phone]  08/26/2016  12:23 PM

## 2016-08-26 NOTE — Patient Instructions (Signed)
If you have any questions or concerns about today's appointment, the verbal and/or written instructions you were given for follow up care, please call our office at [phone].    [facility]  [address], [address]  [place] [address]    [phone] office  [phone]

## 2016-08-26 NOTE — Progress Notes (Signed)
1. Have you been to the ER, urgent care clinic since your last visit?  Hospitalized since your last visit?No    2. Have you seen or consulted any other health care providers outside of the [facility] since your last visit?  Include any pap smears or colon screening. No     Patient presents for post op from hemorrhoidectomy on 08/14/16.

## 2016-08-27 ENCOUNTER — Ambulatory Visit
Admit: 2016-08-27 | Discharge: 2016-08-27 | Payer: PRIVATE HEALTH INSURANCE | Attending: Internal Medicine | Primary: Nurse Practitioner

## 2016-08-27 DIAGNOSIS — I10 Essential (primary) hypertension: Secondary | ICD-10-CM

## 2016-08-27 MED ORDER — TRAMADOL 50 MG TAB
50 mg | ORAL_TABLET | Freq: Four times a day (QID) | ORAL | 0 refills | Status: DC | PRN
Start: 2016-08-27 — End: 2017-12-07

## 2016-08-27 NOTE — Progress Notes (Signed)
1. Have you been to the ER, urgent care clinic since your last visit?  Hospitalized since your last visit?No    2. Have you seen or consulted any other health care providers outside of the [facility] since your last visit?  Include any pap smears or colon screening. No

## 2016-08-27 NOTE — Progress Notes (Signed)
Brandon Alvarado is a 40 y.o.  male and presents with     Chief Complaint   Patient presents with   ??? Hypertension   ??? Ankle Pain   ??? Hemorrhoids       Pt has chronic left ankle pain. Pt saw ortho. Has follow up appt in Dec.   Pt had surgery for hemorrhoids.  Pt says he is taking his blood pressure meds  Not done blood test yet as advised      Past Medical History:   Diagnosis Date   ??? Chronic pain     back   ??? GERD (gastroesophageal reflux disease)    ??? Hypertension    ??? Kidney stone      Past Surgical History:   Procedure Laterality Date   ??? COLONOSCOPY N/A 06/27/2016    COLON[facility]COPY performed by Estil Dafthong S Lee, [facility]   ??? HX ORTHOPAEDIC      left ankle, hardware in place     Current Outpatient Prescriptions   Medication Sig   ??? traMADol (ULTRAM) 50 mg tablet Take 1 Tab by mouth every six (6) hours as needed for Pain. Max Daily Amount: 200 mg.   ??? metoprolol tartrate (LOPRESSOR) 50 mg tablet Take 1 Tab by mouth two (2) times a day.   ??? hydroCHLOROthiazide (HYDRODIURIL) 25 mg tablet Take 1 Tab by mouth daily.   ??? losartan (COZAAR) 50 mg tablet Take 1 Tab by mouth two (2) times a day.   ??? amoxicillin 500 mg tab Take  by mouth.     No current facility-administered medications for this visit.      Health Maintenance   Topic Date Due   ??? Pneumococcal 19-64 Medium Risk (1 of 1 - PPSV23) 09/15/1994   ??? DTaP/Tdap/Td series (1 - Tdap) 09/15/1996   ??? COLONOSCOPY  06/27/2026   ??? Influenza Age 27 to Adult  Addressed       There is no immunization history on file for this patient.  No LMP for male patient.        Allergies and Intolerances:   Allergies   Allergen Reactions   ??? Lisinopril Other (comments)     Sore throat       Family History:   No family history on file.    Social History:   He  reports that he has been smoking Cigarettes.  He has been smoking about 1.50 packs per day. He has never used smokeless tobacco.  He  reports that he drinks alcohol.            Review of Systems:    General: negative for - chills, fatigue, fever, weight change  Psych: negative for - anxiety, depression, irritability or mood swings  ENT: negative for - headaches, hearing change, nasal congestion, oral lesions, sneezing or sore throat  Heme/ Lymph: negative for - bleeding problems, bruising, pallor or swollen lymph nodes  Endo: negative for - hot flashes, polydipsia/polyuria or temperature intolerance  Resp: negative for - cough, shortness of breath or wheezing  CV: negative for - chest pain, edema or palpitations  GI: negative for - abdominal pain, change in bowel habits, constipation, diarrhea or nausea/vomiting  GU: negative for - dysuria, hematuria, incontinence, pelvic pain or vulvar/vaginal symptoms  MSK: negative for - joint pain, joint swelling or muscle pain, pos for left ankle pain  Neuro: negative for - confusion, headaches, seizures or weakness  Derm: negative for - dry skin, hair changes, rash or skin  lesion changes          Physical:   Vitals:   Vitals:    08/27/16 1405   BP: (!) 162/108   Pulse: 76   Resp: 16   Temp: 97.8 ??F (36.6 ??C)   TempSrc: Oral   SpO2: 99%   Weight: 214 lb (97.1 kg)   Height: 5\' 5"  (1.651 m)           Exam:   HEENT- atraumatic,normocephalic, awake, oriented, well nourished  Neck - supple,no enlarged lymph nodes, no JVD, no thyromegaly  Chest- CTA, no rhonchi, no crackles  Heart- rrr, no murmurs / gallop/rub  Abdomen- soft,BS+,NT, no hepatosplenomegaly  Ext - no c/c/edema   Neuro- no focal deficits.Power 5/5 all extremities  Skin - warm,dry, no obvious rashes.          Review of Data:   LABS:   Lab Results   Component Value Date/Time    WBC 4.9 03/05/2016 09:00 AM    HGB 14.5 03/05/2016 09:00 AM    HCT 42.5 03/05/2016 09:00 AM    PLATELET 230 03/05/2016 09:00 AM     Lab Results   Component Value Date/Time    Sodium 139 03/05/2016 09:00 AM    Potassium 4.8 08/14/2016 10:00 AM    Chloride 106 03/05/2016 09:00 AM    CO2 24 03/05/2016 09:00 AM    Glucose 138 03/05/2016 09:00 AM     BUN 10 03/05/2016 09:00 AM    Creatinine 1.12 03/05/2016 09:00 AM     No results found for: CHOL, CHOLX, CHLST, CHOLV, HDL, LDL, LDLC, DLDLP, TGLX, TRIGL, TRIGP  No results found for: GPT        Impression / Plan:        ICD-10-CM ICD-9-CM    1. Essential hypertension I10 401.9 CBC WITH AUTOMATED DIFF      METABOLIC PANEL, COMPREHENSIVE      LIPID PANEL      URIC ACID      METANEPHRINES, PLASMA   2. Left ankle pain, unspecified chronicity M25.572 719.47 traMADol (ULTRAM) 50 mg tablet     Low salt diet.      Asked pt to do labs.    Asked pt to check with Ortho regarding his ankle pain, he has h/o surgery in the past.    Explained to patient risk benefits of the medications.Advised patient to stop meds if having any side effects.Pt verbalized understanding of the instructions.    I have discussed the diagnosis with the patient and the intended plan as seen in the above orders.  The patient has received an after-visit summary and questions were answered concerning future plans.  I have discussed medication side effects and warnings with the patient as well. I have reviewed the plan of care with the patient, accepted their input and they are in agreement with the treatment goals.     Reviewed plan of care. Patient has provided input and agrees with goals.    Follow-up Disposition: Not on File    Jenene SlickerPravin M Garlin Batdorf, MD

## 2016-08-28 LAB — LIPID PANEL
Cholesterol, total: 247 mg/dL — ABNORMAL HIGH (ref 110–200)
HDL Cholesterol: 34 mg/dL — ABNORMAL LOW (ref 40–59)
LDL, calculated: 168 mg/dL — ABNORMAL HIGH (ref 50–99)
Triglyceride: 224 mg/dL — ABNORMAL HIGH (ref 40–149)
VLDL, calculated: 45 mg/dL — ABNORMAL HIGH (ref 8–30)

## 2016-08-28 LAB — CBC WITH AUTOMATED DIFF
ABS. BASOPHILS: 0 10*3/uL (ref 0.0–0.2)
ABS. EOSINOPHILS: 0.4 10*3/uL (ref 0.0–0.5)
ABS. MONOCYTES: 0.6 10*3/uL (ref 0.1–0.9)
ABS. NEUTROPHILS: 3.9 10*3/uL (ref 1.8–7.7)
ABSOLUTE LYMPHOCYTE COUNT: 2.7 10*3/uL (ref 1.0–4.8)
BASOPHILS: 0 % (ref 0–2)
EOSINOPHILS: 5 % (ref 0–6)
HCT: 39.4 % (ref 36.6–51.9)
HGB: 13 g/dL — ABNORMAL LOW (ref 13.2–17.3)
Lymphocytes: 36 % (ref 27–45)
MCH: 30 pg (ref 26–34)
MCHC: 33 g/dL (ref 32–36)
MCV: 92 fL (ref 80–95)
MONOCYTES: 7 % (ref 3–9)
MPV: 10.7 fL (ref 6.0–10.8)
NEUTROPHILS: 51 % (ref 40–75)
PLATELET: 399 10*3/uL (ref 140–440)
RBC: 4.29 M/uL (ref 3.80–5.80)
RDW: 13.8 % (ref 10.0–16.0)
WBC: 7.5 10*3/uL (ref 4.0–11.0)

## 2016-08-28 LAB — METABOLIC PANEL, COMPREHENSIVE
A-G Ratio: 1.4 ratio (ref 1.1–2.6)
ALT (SGPT): 10 U/L (ref 5–40)
AST (SGOT): 12 U/L (ref 10–37)
Albumin: 4.2 g/dL (ref 3.5–5.0)
Alk. phosphatase: 60 U/L (ref 25–115)
Anion gap: 17 mmol/L
BUN: 21 mg/dL (ref 6–22)
Bilirubin, total: 0.5 mg/dL (ref 0.2–1.2)
CO2: 25 mmol/L (ref 20–32)
Calcium: 9.7 mg/dL (ref 8.4–10.4)
Chloride: 99 mmol/L (ref 98–110)
Creatinine: 1 mg/dL (ref 0.5–1.2)
GFRAA: >60 (ref >60.0)
GFRNA: >60 (ref >60.0)
Globulin: 2.9 g/dL (ref 2.0–4.0)
Glucose: 90 mg/dL (ref 70–99)
Potassium: 3.6 mmol/L (ref 3.5–5.5)
Protein, total: 7.1 g/dL (ref 6.4–8.3)
Sodium: 141 mmol/L (ref 133–145)

## 2016-08-28 LAB — URIC ACID: Uric acid: 6 mg/dL (ref 3.9–9.0)

## 2016-09-02 LAB — METANEPHRINES, PLASMA
METANEPHRINES,PLASMA: 39 pg/mL (ref 0–62)
Normetanephrine, free: 89 pg/mL (ref 0–145)

## 2016-09-03 ENCOUNTER — Ambulatory Visit
Admit: 2016-09-03 | Discharge: 2016-09-03 | Payer: PRIVATE HEALTH INSURANCE | Attending: Internal Medicine | Primary: Nurse Practitioner

## 2016-09-03 DIAGNOSIS — I10 Essential (primary) hypertension: Secondary | ICD-10-CM

## 2016-09-03 MED ORDER — AMLODIPINE 10 MG TAB
10 mg | ORAL_TABLET | Freq: Every day | ORAL | 3 refills | Status: DC
Start: 2016-09-03 — End: 2017-12-07

## 2016-09-03 MED ORDER — AMOXICILLIN CLAVULANATE 875 MG-125 MG TAB
875-125 mg | ORAL_TABLET | Freq: Two times a day (BID) | ORAL | 0 refills | Status: AC
Start: 2016-09-03 — End: 2016-09-10

## 2016-09-03 MED ORDER — ATORVASTATIN 20 MG TAB
20 mg | ORAL_TABLET | Freq: Every day | ORAL | 3 refills | Status: DC
Start: 2016-09-03 — End: 2017-12-10

## 2016-09-03 MED ORDER — MUPIROCIN 2 % OINTMENT
2 % | Freq: Every day | CUTANEOUS | 3 refills | Status: DC
Start: 2016-09-03 — End: 2017-12-07

## 2016-09-03 NOTE — Progress Notes (Signed)
Brandon Alvarado is a 40 y.o.  male and presents with     Chief Complaint   Patient presents with   ??? Hypertension   ??? Leg Problem     right leg, by ankle    ??? Cholesterol Problem     Pt says he saw Ortho for his leg pain and was offered injections in the leg where he has the pain.  However he noticed mild swelling over the scar tissue on his leg where he had the surgery done in the past.  Blood pressure is up., pt says he is taking his meds.      Past Medical History:   Diagnosis Date   ??? Chronic pain     back   ??? GERD (gastroesophageal reflux disease)    ??? Hypertension    ??? Kidney stone      Past Surgical History:   Procedure Laterality Date   ??? COLONOSCOPY N/A 06/27/2016    [facility] S [facility]   ??? HX ORTHOPAEDIC      left ankle, hardware in place     Current Outpatient Prescriptions   Medication Sig   ??? atorvastatin (LIPITOR) 20 mg tablet Take 1 Tab by mouth daily.   ??? amoxicillin-clavulanate (AUGMENTIN) 875-125 mg per tablet Take 1 Tab by mouth two (2) times a day for 7 days.   ??? mupirocin (BACTROBAN) 2 % ointment Apply  to affected area daily.   ??? amLODIPine (NORVASC) 10 mg tablet Take 1 Tab by mouth daily.   ??? metoprolol tartrate (LOPRESSOR) 50 mg tablet Take 1 Tab by mouth two (2) times a day.   ??? hydroCHLOROthiazide (HYDRODIURIL) 25 mg tablet Take 1 Tab by mouth daily.   ??? losartan (COZAAR) 50 mg tablet Take 1 Tab by mouth two (2) times a day.   ??? traMADol (ULTRAM) 50 mg tablet Take 1 Tab by mouth every six (6) hours as needed for Pain. Max Daily Amount: 200 mg.     No current facility-administered medications for this visit.      Health Maintenance   Topic Date Due   ??? Pneumococcal 19-64 Medium Risk (1 of 1 - PPSV23) 09/15/1994   ??? DTaP/Tdap/Td series (1 - Tdap) 09/15/1996   ??? COLONOSCOPY  06/27/2026   ??? Influenza Age 58 to Adult  Addressed       There is no immunization history on file for this patient.  No LMP for male patient.        Allergies and Intolerances:    Allergies   Allergen Reactions   ??? Lisinopril Other (comments)     Sore throat       Family History:   No family history on file.    Social History:   He  reports that he has been smoking Cigarettes.  He has been smoking about 1.50 packs per day. He has never used smokeless tobacco.  He  reports that he drinks alcohol.            Review of Systems:   General: negative for - chills, fatigue, fever, weight change  Psych: negative for - anxiety, depression, irritability or mood swings  ENT: negative for - headaches, hearing change, nasal congestion, oral lesions, sneezing or sore throat  Heme/ Lymph: negative for - bleeding problems, bruising, pallor or swollen lymph nodes  Endo: negative for - hot flashes, polydipsia/polyuria or temperature intolerance  Resp: negative for - cough, shortness of breath or wheezing  CV: negative for - chest pain, edema or palpitations  GI: negative for - abdominal pain, change in bowel habits, constipation, diarrhea or nausea/vomiting  GU: negative for - dysuria, hematuria, incontinence, pelvic pain or vulvar/vaginal symptoms  MSK: negative for - joint pain, joint swelling or muscle pain  Neuro: negative for - confusion, headaches, seizures or weakness  Derm: negative for - dry skin, hair changes, rash or skin lesion changes, pos for small sweling on his leg          Physical:   Vitals:   Vitals:    09/03/16 0823 09/03/16 0824   BP: (!) 170/120 (!) 161/105   Pulse: 85    Resp: 18    Temp: 97.9 ??F (36.6 ??C)    TempSrc: Oral    SpO2: 95%    Weight: 212 lb 9.6 oz (96.4 kg)    Height: 5\' 5"  (1.651 m)            Exam:   HEENT- atraumatic,normocephalic, awake, oriented, well nourished  Neck - supple,no enlarged lymph nodes, no JVD, no thyromegaly  Chest- CTA, no rhonchi, no crackles  Heart- rrr, no murmurs / gallop/rub  Abdomen- soft,BS+,NT, no hepatosplenomegaly  Ext - no c/c/edema ,small slightly tender swelling overlying the scar tissue on the leg   Neuro- no focal deficits.Power 5/5 all extremities  Skin - warm,dry, no obvious rashes.          Review of Data:   LABS:   Lab Results   Component Value Date/Time    WBC 7.5 08/27/2016 02:44 PM    HGB 13.0 08/27/2016 02:44 PM    HCT 39.4 08/27/2016 02:44 PM    PLATELET 399 08/27/2016 02:44 PM     Lab Results   Component Value Date/Time    Sodium 141 08/27/2016 02:44 PM    Potassium 3.6 08/27/2016 02:44 PM    Chloride 99 08/27/2016 02:44 PM    CO2 25 08/27/2016 02:44 PM    Glucose 90 08/27/2016 02:44 PM    BUN 21 08/27/2016 02:44 PM    Creatinine 1.0 08/27/2016 02:44 PM     Lab Results   Component Value Date/Time    Cholesterol, total 247 08/27/2016 02:44 PM    HDL Cholesterol 34 08/27/2016 02:44 PM    LDL, calculated 168 08/27/2016 02:44 PM    Triglyceride 224 08/27/2016 02:44 PM     No results found for: GPT        Impression / Plan:        ICD-10-CM ICD-9-CM    1. Essential hypertension I10 401.9 amLODIPine (NORVASC) 10 mg tablet   2. Hypercholesteremia E78.00 272.0 atorvastatin (LIPITOR) 20 mg tablet   3. Furuncle L02.92 680.9 amoxicillin-clavulanate (AUGMENTIN) 875-125 mg per tablet      mupirocin (BACTROBAN) 2 % ointment     Low salt diet.    Explained to patient risk benefits of the medications.Advised patient to stop meds if having any side effects.Pt verbalized understanding of the instructions.    I have discussed the diagnosis with the patient and the intended plan as seen in the above orders.  The patient has received an after-visit summary and questions were answered concerning future plans.  I have discussed medication side effects and warnings with the patient as well. I have reviewed the plan of care with the patient, accepted their input and they are in agreement with the treatment goals.     Reviewed plan of care. Patient has provided input and agrees with goals.  Follow-up Disposition: Not on File    Elnora Morrison, MD

## 2016-09-03 NOTE — Progress Notes (Signed)
Chief Complaint   Patient presents with   ??? Hypertension   ??? Leg Problem     right leg, by ankle      1. Have you been to the ER, urgent care clinic since your last visit?  Hospitalized since your last visit?No    2. Have you seen or consulted any other health care providers outside of the [facility] since your last visit?  Include any pap smears or colon screening. No

## 2016-09-09 ENCOUNTER — Ambulatory Visit
Admit: 2016-09-09 | Discharge: 2016-09-09 | Payer: PRIVATE HEALTH INSURANCE | Attending: Surgery | Primary: Nurse Practitioner

## 2016-09-09 DIAGNOSIS — K648 Other hemorrhoids: Secondary | ICD-10-CM

## 2016-09-09 NOTE — Progress Notes (Signed)
Dorna Maiarnest Bruington is a 40 y.o. male who presents today with   Chief Complaint   Patient presents with   ??? Surgical Follow-up     Hemmorrhoidectomy 08/14/2016                1. Have you been to the ER, urgent care clinic since your last visit?  Hospitalized since your last visit?No    2. Have you seen or consulted any other health care providers outside of the [facility] since your last visit?  Include any pap smears or colon screening. No

## 2016-09-09 NOTE — Progress Notes (Signed)
[facility]  [address], [address]  [place], [place] [id number]              Patient: Brandon Alvarado  Admitted: (Not on file) MRN: [id number]     Age:  40 y.o.,      Sex: male    Date of Birth:  08/06/1976       Subjective    Brandon Alvarado is an 40 y.o. male who is status post Complex 3??Quadrant External and Internal Hemorrhoidectomy??on 08/14/2016 for severe symptomatic hemorrhoid disease.   ??  He is doing well and without complaints.  His bowels are regular, and the patient denies fever.      Objective    Vitals:    09/09/16 0938 09/09/16 0940   BP: (!) 174/120 (!) 173/121   Pulse: 72    Temp: 97.3 ??F (36.3 ??C)    TempSrc: Oral    Weight: 96.2 kg (212 lb)    PainSc:   4    PainLoc: Rectum        Physical Exam:  General: alert, cooperative, no distress, appears stated age  Anorectal:  With the patient in the prone position the anus appeared within normal limits with well healing operative sites.  Digital rectal examination revealed increased sphincter tone and squeeze pressure.  Palpation revealed No Masses.         Assessment / Plan    Brandon Alvarado is doing well postoperatively.    The patient will return as needed for follow up.          Estil Dafthong S. Nekayla Heider, MD, FACS, FASCRS  Colon and Rectal Surgery  [facility]  Office [phone]  Fax     [phone]  09/09/2016  9:54 AM

## 2016-09-26 ENCOUNTER — Encounter: Attending: Internal Medicine | Primary: Nurse Practitioner

## 2016-10-02 NOTE — Progress Notes (Signed)
Pt was a No Show 09/26/16. Letter #2 sent 12/21/17DMA/ms.

## 2016-11-20 ENCOUNTER — Encounter

## 2016-11-21 MED ORDER — METOPROLOL TARTRATE 25 MG TAB
25 mg | ORAL_TABLET | ORAL | 1 refills | Status: DC
Start: 2016-11-21 — End: 2017-12-07

## 2016-11-21 MED ORDER — LOSARTAN 50 MG TAB
50 mg | ORAL_TABLET | ORAL | 1 refills | Status: DC
Start: 2016-11-21 — End: 2017-12-07

## 2017-05-25 NOTE — Telephone Encounter (Signed)
Medical record request sent on 05/20/2017. Status report given. This encounter will be closed.

## 2017-05-25 NOTE — Telephone Encounter (Signed)
[facility]isa [facility] medical [facility].Please assist

## 2017-06-14 ENCOUNTER — Encounter

## 2017-06-15 NOTE — Telephone Encounter (Signed)
Last seen Nov 2017

## 2017-06-17 NOTE — Telephone Encounter (Signed)
Letter sent. This encounter will be closed.

## 2017-10-04 ENCOUNTER — Emergency Department: Admit: 2017-10-04 | Payer: Self-pay | Primary: Nurse Practitioner

## 2017-10-04 ENCOUNTER — Inpatient Hospital Stay: Admit: 2017-10-04 | Payer: Self-pay | Primary: Nurse Practitioner

## 2017-10-04 ENCOUNTER — Inpatient Hospital Stay
Admit: 2017-10-04 | Discharge: 2017-10-05 | Discharge status: Against Medical Advice | Payer: Self-pay | Attending: Internal Medicine | Admitting: Internal Medicine

## 2017-10-04 DIAGNOSIS — J810 Acute pulmonary edema: Principal | ICD-10-CM

## 2017-10-04 LAB — POC VENOUS BLOOD GAS
Base excess, venous (POC): 0 mmol/L
FIO2 (POC): 50 %
HCO3, venous (POC): 26 MMOL/L (ref 23.0–28.0)
PEEP/CPAP (POC): 10 cmH2O
PIP (POC): 19
Patient temp.: 97.8
Pressure support: 10 cmH2O
Total resp. rate: 29
pCO2, venous (POC): 49.8 MMHG (ref 41–51)
pH, venous (POC): 7.324 (ref 7.32–7.42)
pO2, venous (POC): 25 mmHg (ref 25–40)
sO2, venous (POC): 42 % — ABNORMAL LOW (ref 65–88)

## 2017-10-04 LAB — ECHO ADULT COMPLETE
AV Peak Gradient: 0 mmHg
AV Peak Velocity: 0 cm/s
EF BP: 56.1 % (ref 55–100)
Est. RA Pressure: 10 mmHg
IVSd: 1.23 cm — AB (ref 0.6–1.0)
LA Volume 2C: 102.85 mL — AB (ref 18–58)
LA Volume 4C: 85.11 mL — AB (ref 18–58)
LA Volume Index 2C: 53.75 ml/m2 (ref 16–28)
LA Volume Index 4C: 44.48 ml/m2 (ref 16–28)
LV EDV A2C: 156.8 mL
LV EDV A4C: 184.1 mL
LV EDV BP: 173.1 ml — AB (ref 67–155)
LV EDV Index A2C: 81.9 mL/m2
LV EDV Index A4C: 96.2 mL/m2
LV EDV Index BP: 90.5 mL/m2
LV ESV A2C: 65.4 mL
LV ESV A4C: 84.7 mL
LV ESV BP: 76 mL — AB (ref 22–58)
LV ESV Index A2C: 34.2 mL/m2
LV ESV Index A4C: 44.3 mL/m2
LV ESV Index BP: 39.7 mL/m2
LV Ejection Fraction A2C: 58 %
LV Ejection Fraction A4C: 54 %
LV IVRT: 37 ms
LV Mass 2D Index: 188.9 g/m2 — AB (ref 49–115)
LV Mass 2D: 361.4 g — AB (ref 88–224)
LVIDd: 5.93 cm — AB (ref 4.2–5.9)
LVIDs: 4.31 cm
LVOT Diameter: 2.02 cm
LVOT Peak Gradient: 4.4 mmHg
LVOT Peak Velocity: 105.09 cm/s
LVPWd: 1.13 cm — AB (ref 0.6–1.0)
PASP: 43 mmHg
RVSP: 10 mmHg
TR Max Velocity: 0 cm/s
TR Peak Gradient: 0 mmHg

## 2017-10-04 LAB — CBC WITH AUTOMATED DIFF
ABS. BASOPHILS: 0 10*3/uL (ref 0.0–0.1)
ABS. EOSINOPHILS: 0.1 10*3/uL (ref 0.0–0.4)
ABS. LYMPHOCYTES: 3.1 10*3/uL (ref 0.9–3.6)
ABS. MONOCYTES: 0.6 10*3/uL (ref 0.05–1.2)
ABS. NEUTROPHILS: 3 10*3/uL (ref 1.8–8.0)
BASOPHILS: 0 % (ref 0–2)
EOSINOPHILS: 2 % (ref 0–5)
HCT: 42 % (ref 36.0–48.0)
HGB: 14.3 g/dL (ref 13.0–16.0)
LYMPHOCYTES: 45 % (ref 21–52)
MCH: 31.4 PG (ref 24.0–34.0)
MCHC: 34 g/dL (ref 31.0–37.0)
MCV: 92.1 FL (ref 74.0–97.0)
MONOCYTES: 9 % (ref 3–10)
MPV: 10.3 FL (ref 9.2–11.8)
NEUTROPHILS: 44 % (ref 40–73)
PLATELET: 231 10*3/uL (ref 135–420)
RBC: 4.56 M/uL — ABNORMAL LOW (ref 4.70–5.50)
RDW: 14.2 % (ref 11.6–14.5)
WBC: 6.8 10*3/uL (ref 4.6–13.2)

## 2017-10-04 LAB — METABOLIC PANEL, COMPREHENSIVE
A-G Ratio: 1 (ref 0.8–1.7)
ALT (SGPT): 74 U/L — ABNORMAL HIGH (ref 16–61)
AST (SGOT): 63 U/L — ABNORMAL HIGH (ref 15–37)
Albumin: 3.6 g/dL (ref 3.4–5.0)
Alk. phosphatase: 107 U/L (ref 45–117)
Anion gap: 9 mmol/L (ref 3.0–18)
BUN/Creatinine ratio: 9 — ABNORMAL LOW (ref 12–20)
BUN: 9 MG/DL (ref 7.0–18)
Bilirubin, total: 0.5 MG/DL (ref 0.2–1.0)
CO2: 24 mmol/L (ref 21–32)
Calcium: 8.3 MG/DL — ABNORMAL LOW (ref 8.5–10.1)
Chloride: 111 mmol/L — ABNORMAL HIGH (ref 100–108)
Creatinine: 0.95 MG/DL (ref 0.6–1.3)
GFR est AA: >60 mL/min/{1.73_m2} (ref >60)
GFR est non-AA: >60 mL/min/{1.73_m2} (ref >60)
Globulin: 3.5 g/dL (ref 2.0–4.0)
Glucose: 93 mg/dL (ref 74–99)
Potassium: 4 mmol/L (ref 3.5–5.5)
Protein, total: 7.1 g/dL (ref 6.4–8.2)
Sodium: 144 mmol/L (ref 136–145)

## 2017-10-04 LAB — ETHYL ALCOHOL: ALCOHOL(ETHYL),SERUM: 208 MG/DL — ABNORMAL HIGH (ref 0–3)

## 2017-10-04 LAB — DRUG SCREEN, URINE
AMPHETAMINES: NEGATIVE
BARBITURATES: NEGATIVE
BENZODIAZEPINES: NEGATIVE
COCAINE: NEGATIVE
METHADONE: NEGATIVE
OPIATES: NEGATIVE
PCP(PHENCYCLIDINE): NEGATIVE
THC (TH-CANNABINOL): NEGATIVE

## 2017-10-04 LAB — TROPONIN I
Troponin-I, QT: 0.06 NG/ML — ABNORMAL HIGH (ref 0.0–0.045)
Troponin-I, QT: 0.03 NG/ML (ref 0.0–0.045)

## 2017-10-04 LAB — EKG, 12 LEAD, INITIAL
Atrial Rate: 101 {beats}/min
Calculated P Axis: 49 degrees
Calculated R Axis: 67 degrees
Calculated T Axis: 39 degrees
P-R Interval: 144 ms
Q-T Interval: 418 ms
QRS Duration: 106 ms
QTC Calculation (Bezet): 542 ms
Ventricular Rate: 101 {beats}/min

## 2017-10-04 LAB — NT-PRO BNP: NT pro-BNP: 224 PG/ML (ref 0–450)

## 2017-10-04 LAB — TRANSTHORACIC ECHOCARDIOGRAM (TTE) COMPLETE (CONTRAST/BUBBLE/3D PRN)
AV Peak Gradient: 0 mmHg
AV Peak Velocity: 0 cm/s
EF BP: 56.1 % (ref 55–100)
Est. RA Pressure: 10 mmHg
IVSd: 1.23 cm — AB (ref 0.6–1)
LA Volume 2C: 102.85 mL — AB (ref 18–58)
LA Volume 4C: 85.11 mL — AB (ref 18–58)
LA Volume Index 2C: 53.75 ml/m2 (ref 16–28)
LA Volume Index 4C: 44.48 ml/m2 (ref 16–28)
LV EDV A2C: 156.8 mL
LV EDV A4C: 184.1 mL
LV EDV BP: 173.1 ml — AB (ref 67–155)
LV EDV Index A2C: 81.9 mL/m2
LV EDV Index A4C: 96.2 mL/m2
LV EDV Index BP: 90.5 mL/m2
LV ESV A2C: 65.4 mL
LV ESV A4C: 84.7 mL
LV ESV BP: 76 mL — AB (ref 22–58)
LV ESV Index A2C: 34.2 mL/m2
LV ESV Index A4C: 44.3 mL/m2
LV ESV Index BP: 39.7 mL/m2
LV Ejection Fraction A2C: 58 %
LV Ejection Fraction A4C: 54 %
LV IVRT: 37 ms
LV Mass 2D Index: 188.9 g/m2 — AB (ref 49–115)
LV Mass 2D: 361.4 g — AB (ref 88–224)
LVIDd: 5.93 cm — AB (ref 4.2–5.9)
LVIDs: 4.31 cm
LVOT Diameter: 2.02 cm
LVOT Peak Gradient: 4.4 mmHg
LVOT Peak Velocity: 105.09 cm/s
LVPWd: 1.13 cm — AB (ref 0.6–1)
Left Ventricular Ejection Fraction: 58
PASP: 43 mmHg
RVSP: 10 mmHg
TR Max Velocity: 0 cm/s
TR Peak Gradient: 0 mmHg

## 2017-10-04 LAB — EKG 12-LEAD
Atrial Rate: 101 {beats}/min
P Axis: 49 degrees
P-R Interval: 144 ms
Q-T Interval: 418 ms
QRS Duration: 106 ms
QTc Calculation (Bazett): 542 ms
R Axis: 67 degrees
T Axis: 39 degrees
Ventricular Rate: 101 {beats}/min

## 2017-10-04 MED ORDER — SODIUM CHLORIDE 0.9 % IV
25 mg/10 mL | INTRAVENOUS | Status: DC
Start: 2017-10-04 — End: 2017-10-05
  Administered 2017-10-04 – 2017-10-05 (×5): via INTRAVENOUS

## 2017-10-04 MED ORDER — ACETAMINOPHEN 325 MG TABLET
325 mg | ORAL | Status: DC | PRN
Start: 2017-10-04 — End: 2017-10-05

## 2017-10-04 MED ORDER — HYDROCHLOROTHIAZIDE 25 MG TAB
25 mg | Freq: Every day | ORAL | Status: DC
Start: 2017-10-04 — End: 2017-10-05
  Administered 2017-10-04: 16:00:00 via ORAL

## 2017-10-04 MED ORDER — AMLODIPINE 5 MG TAB
5 mg | Freq: Every day | ORAL | Status: DC
Start: 2017-10-04 — End: 2017-10-05
  Administered 2017-10-04: 16:00:00 via ORAL

## 2017-10-04 MED ORDER — METHYLPREDNISOLONE (PF) 125 MG/2 ML IJ SOLR
125 mg/2 mL | INTRAMUSCULAR | Status: AC
Start: 2017-10-04 — End: 2017-10-04
  Administered 2017-10-04: 10:00:00 via INTRAVENOUS

## 2017-10-04 MED ORDER — IPRATROPIUM-ALBUTEROL 2.5 MG-0.5 MG/3 ML NEB SOLUTION
2.5 mg-0.5 mg/3 ml | Freq: Once | RESPIRATORY_TRACT | Status: AC
Start: 2017-10-04 — End: 2017-10-04
  Administered 2017-10-04: 10:00:00 via RESPIRATORY_TRACT

## 2017-10-04 MED ORDER — SODIUM CHLORIDE 0.9 % IJ SYRG
INTRAMUSCULAR | Status: DC | PRN
Start: 2017-10-04 — End: 2017-10-05

## 2017-10-04 MED ORDER — FAMOTIDINE 20 MG TAB
20 mg | Freq: Every day | ORAL | Status: DC
Start: 2017-10-04 — End: 2017-10-05
  Administered 2017-10-04: 16:00:00 via ORAL

## 2017-10-04 MED ORDER — BUMETANIDE 0.25 MG/ML IJ SOLN
0.25 mg/mL | Freq: Two times a day (BID) | INTRAMUSCULAR | Status: DC
Start: 2017-10-04 — End: 2017-10-05

## 2017-10-04 MED ORDER — HYDRALAZINE 20 MG/ML IJ SOLN
20 mg/mL | Freq: Four times a day (QID) | INTRAMUSCULAR | Status: DC | PRN
Start: 2017-10-04 — End: 2017-10-05
  Administered 2017-10-04: 21:00:00 via INTRAVENOUS

## 2017-10-04 MED ORDER — NITROGLYCERIN 0.4 MG/HR TRANSDERM 24 HR PATCH
0.4 mg/hr | Freq: Every day | TRANSDERMAL | Status: DC
Start: 2017-10-04 — End: 2017-10-04

## 2017-10-04 MED ORDER — SODIUM CHLORIDE 0.9 % IJ SYRG
Freq: Three times a day (TID) | INTRAMUSCULAR | Status: DC
Start: 2017-10-04 — End: 2017-10-05

## 2017-10-04 MED ORDER — ONDANSETRON (PF) 4 MG/2 ML INJECTION
4 mg/2 mL | INTRAMUSCULAR | Status: DC | PRN
Start: 2017-10-04 — End: 2017-10-05

## 2017-10-04 MED ORDER — METOPROLOL TARTRATE 50 MG TAB
50 mg | Freq: Two times a day (BID) | ORAL | Status: DC
Start: 2017-10-04 — End: 2017-10-05
  Administered 2017-10-04: 16:00:00 via ORAL

## 2017-10-04 MED ORDER — TRAMADOL 50 MG TAB
50 mg | Freq: Four times a day (QID) | ORAL | Status: DC | PRN
Start: 2017-10-04 — End: 2017-10-05

## 2017-10-04 MED ORDER — NITROGLYCERIN 2 % TRANSDERMAL OINTMENT
2 % | TRANSDERMAL | Status: AC
Start: 2017-10-04 — End: 2017-10-04
  Administered 2017-10-04: 10:00:00 via TOPICAL

## 2017-10-04 MED ORDER — NITROGLYCERIN 0.4 MG SUBLINGUAL TAB
0.4 mg | SUBLINGUAL | Status: AC
Start: 2017-10-04 — End: 2017-10-04
  Administered 2017-10-04: 10:00:00 via SUBLINGUAL

## 2017-10-04 MED ORDER — ATORVASTATIN 40 MG TAB
40 mg | Freq: Every day | ORAL | Status: DC
Start: 2017-10-04 — End: 2017-10-05
  Administered 2017-10-04: 16:00:00 via ORAL

## 2017-10-04 MED ORDER — LOSARTAN 50 MG TAB
50 mg | Freq: Every day | ORAL | Status: DC
Start: 2017-10-04 — End: 2017-10-05
  Administered 2017-10-04: 16:00:00 via ORAL

## 2017-10-04 MED ORDER — BUMETANIDE 0.25 MG/ML IJ SOLN
0.25 mg/mL | Freq: Every day | INTRAMUSCULAR | Status: DC
Start: 2017-10-04 — End: 2017-10-04
  Administered 2017-10-04: 21:00:00 via INTRAVENOUS

## 2017-10-04 MED FILL — NITROSTAT 0.4 MG SUBLINGUAL TABLET: 0.4 mg | SUBLINGUAL | Qty: 1

## 2017-10-04 MED FILL — SOLU-MEDROL (PF) 125 MG/2 ML SOLUTION FOR INJECTION: 125 mg/2 mL | INTRAMUSCULAR | Qty: 2

## 2017-10-04 MED FILL — HYDRALAZINE 20 MG/ML IJ SOLN: 20 mg/mL | INTRAMUSCULAR | Qty: 1

## 2017-10-04 MED FILL — AMLODIPINE 5 MG TAB: 5 mg | ORAL | Qty: 2

## 2017-10-04 MED FILL — NICARDIPINE 2.5 MG/ML IV: 25 mg/10 mL | INTRAVENOUS | Qty: 10

## 2017-10-04 MED FILL — NITROGLYCERIN 0.4 MG/HR TRANSDERM 24 HR PATCH: 0.4 mg/hr | TRANSDERMAL | Qty: 1

## 2017-10-04 MED FILL — IPRATROPIUM-ALBUTEROL 2.5 MG-0.5 MG/3 ML NEB SOLUTION: 2.5 mg-0.5 mg/3 ml | RESPIRATORY_TRACT | Qty: 3

## 2017-10-04 MED FILL — HYDROCHLOROTHIAZIDE 25 MG TAB: 25 mg | ORAL | Qty: 1

## 2017-10-04 MED FILL — METOPROLOL TARTRATE 50 MG TAB: 50 mg | ORAL | Qty: 1

## 2017-10-04 MED FILL — NITRO-BID 2 % TRANSDERMAL OINTMENT: 2 % | TRANSDERMAL | Qty: 1

## 2017-10-04 MED FILL — LOSARTAN 50 MG TAB: 50 mg | ORAL | Qty: 1

## 2017-10-04 MED FILL — BD POSIFLUSH NORMAL SALINE 0.9 % INJECTION SYRINGE: INTRAMUSCULAR | Qty: 10

## 2017-10-04 MED FILL — BUMETANIDE 0.25 MG/ML IJ SOLN: 0.25 mg/mL | INTRAMUSCULAR | Qty: 10

## 2017-10-04 MED FILL — FAMOTIDINE 20 MG TAB: 20 mg | ORAL | Qty: 1

## 2017-10-04 MED FILL — ATORVASTATIN 40 MG TAB: 40 mg | ORAL | Qty: 1

## 2017-10-04 NOTE — Progress Notes (Signed)
Chaplain met with Patient completed the initial Spiritual Assessment of the patient, and offered Pastoral Care, see flow sheets for interventions. Patient does not have any religious/cultural needs that will affect patient???s preferences in health care. Charted reviewed. Chaplains will continue to follow and will provide pastoral care on an as needed/requested basis.      Chaplain Ronald H McLean, MPH,   M Div  Spiritual Care Services  [phone]

## 2017-10-04 NOTE — ED Notes (Addendum)
Patient coughing up large amounts of clear, thick sputum

## 2017-10-04 NOTE — ED Notes (Signed)
Shift change report given to Colin BroachKaren S. RN, no longer primary nurse.

## 2017-10-04 NOTE — Progress Notes (Addendum)
Bipap check done.    Weaning Process:    Placed patient on IPAP 12 EPAP6  Tidal volume 529, minute volume 10.9   Heartrate 110 RR 22, SAT 98%.      Physican wants patient wean off Bipap.  Patient is placed on 6LPM via nasal cannula.  Heart rate 100 RR 20 , Oxygen Saturation.    Will continue to monitor.    10/04/2017  1310:  RT called to ER.  Patient appears in respiratory distress.  RR 36  .  Placed patient back on Bipap  previous  Settings.  IPAP 12 EPAP  FIO2 40%.     Will continue to monitor.  10/04/2017 1815 PM    PHYSICIAN wants to wean patient off Bipap.  Placed patient on 6lpm via nasal cannula.  SAT 99 RR 25.  Will continue to monitor..Marland Kitchen

## 2017-10-04 NOTE — ED Notes (Signed)
Pt seen at [facility] 09/28/2017, given prescriptions but has not filled them. States he has failed to take any medications for "a while".

## 2017-10-04 NOTE — ED Notes (Signed)
Patient taken to restroom via wheelchair, had bowel movement. No respiratory distress noted. Patient requesting something to eat and given patient TV dinner. Patient tolerating well.

## 2017-10-04 NOTE — ED Triage Notes (Signed)
Pt brought in via EMS for c/o shortness of breath that woke him up out of his sleep. Given 2 duo nebs and 1 albuterol via EMS. Pt tripoding, speaking in short sentences upon ED arrival.

## 2017-10-04 NOTE — ED Notes (Signed)
Report given to Allyson RN

## 2017-10-04 NOTE — ED Notes (Signed)
Patient awake and alert, asking for water and to use the phone. Respiratory at bedside for bipap trial off per Dr. Tawana ScaleWilson's order.

## 2017-10-04 NOTE — ED Notes (Signed)
Nitro patch removed per Dr. Tawana ScaleWilson's order. All patient's oral BP medications taken without difficulty.

## 2017-10-04 NOTE — ED Notes (Signed)
Spoke with Dr. Andrey CampanileWilson about a diet order for patient. Dr. Andrey CampanileWilson confirmed that no order would be placed until patient could come off the bipap.

## 2017-10-04 NOTE — Progress Notes (Signed)
Note inability to get off of bipap.    Will increase bumex, start cardene drip, will definitely need step down at this point.  Echo is really not that bad , some mild pulm HTN.

## 2017-10-04 NOTE — ACP (Advance Care Planning) (Signed)
Chaplain Spoke with Patient at 1120 regarding AMD.  He requested we make Brandon Alvarado, Pt's Uncle,  his primary agent/ [employer] ([phone]) and keep Nicole Johnson the secondary contact.  I provided Pt with the forms needed to complete the ACP upon admission.

## 2017-10-04 NOTE — Progress Notes (Signed)
Called for Bipap. Patient in mild respiratory distress. Placed on Bipap 18/10 and 50%. Patient appears comfortable when I left bedside. RN at bedside administering breathing treatment.

## 2017-10-04 NOTE — ED Provider Notes (Signed)
Emergency Department Treatment Report    Patient: Brandon Alvarado Age: 41 y.o. Sex: male    Date of Birth: [birth year] Admit Date: 10/04/2017 PCP: Elnora Morrison, MD   MRN: [id number]  CSN: [id number]     Room: ER06/06 Time Dictated: 4:31 AM      Chief Complaint   Chief Complaint   Patient presents with   ??? Shortness of Breath       History of Present Illness   41 y.o. male, PMH HTN, childhood history of asthma, presents with acute, moderate SOB with onset 2.5 hours PTA. Patient was seen at [facility] earlier this week for similar complaints, states he forgot to fill the prednisone Rx. CTA at [facility] showed interstitial pulmonary edema on the lungs. Does take his HTN medications. En route to ED, EMS administered duoneb and albuterol treatment. Patient reports he was feeling SOB all day, used a nebulizer and was able to go to sleep. He woke up from sleep SOB. No modifying or aggravating factors were reported. No other concerns or symptoms at this time.    Review of Systems   Constitutional: No fever, chills, or weight loss  Eyes: No visual symptoms.  ENT: No sore throat, runny nose or ear pain.  Respiratory: No cough or wheezing. Positive for SOB.  Cardiovascular: No chest pain, pressure, palpitations, tightness or heaviness.  Gastrointestinal: No vomiting, diarrhea or abdominal pain.  Genitourinary: No dysuria, frequency, or urgency.  Musculoskeletal: No joint pain or swelling.  Integumentary: No rashes.  Neurological: No headaches, sensory or motor symptoms.  Denies complaints in all other systems.    Past Medical/Surgical History     Past Medical History:   Diagnosis Date   ??? Chronic pain     back   ??? GERD (gastroesophageal reflux disease)    ??? Hypertension    ??? Kidney stone      Past Surgical History:   Procedure Laterality Date   ??? COLONOSCOPY N/A 06/27/2016    COLONOSCOPY performed by Donaciano Eva, MD at [facility] ENDOSCOPY   ??? HX HEMORRHOIDECTOMY  08/14/2016   ??? HX ORTHOPAEDIC      left ankle, hardware in place        Social History     Social History     Socioeconomic History   ??? Marital status: SINGLE     Spouse name: Not on file   ??? Number of children: Not on file   ??? Years of education: Not on file   ??? Highest education level: Not on file   Tobacco Use   ??? Smoking status: Current Every Day Smoker     Packs/day: 1.50     Types: Cigarettes   ??? Smokeless tobacco: Never Used   Substance and Sexual Activity   ??? Alcohol use: Yes     Comment: ocassionally   ??? Drug use: No   ??? Sexual activity: Yes     Partners: Female       Family History   No family history on file.    Home Medications     Cannot display prior to admission medications because the patient has not been admitted in this contact.       Allergies     Allergies   Allergen Reactions   ??? Lisinopril Other (comments)     Sore throat       Physical Exam     ED Triage Vitals   ED Encounter Vitals Group      BP  Pulse       Resp       Temp       Temp src       SpO2       Weight       Height      Constitutional: Patient appears well developed and well nourished. Appearance and behavior are age and situation appropriate.  HEENT: Conjunctiva clear.  PERRLA. Mucous membranes moist, non-erythematous. Surface of the pharynx, palate, and tongue are pink, moist and without lesions.  Neck: supple, non tender, symmetrical, no masses or JVD.   Respiratory: lungs clear to auscultation, nonlabored respirations. No tachypnea or accessory muscle use.  Cardiovascular: heart regular rate and rhythm without murmur rubs or gallops.   Calves soft and non-tender. Distal pulses 2+ and equal bilaterally.  No peripheral edema.   Gastrointestinal:  Abdomen soft, nontender without complaint of pain to palpation  Musculoskeletal: Nail beds pink with prompt capillary refill  Integumentary: warm and dry without rashes or lesions  Neurologic: alert and oriented, Sensation intact, motor strength equal and symmetric.  No facial asymmetry or dysarthria.    Diagnostic Studies   Lab:    Recent Results (from the past 12 hour(s))   CBC WITH AUTOMATED DIFF    Collection Time: 10/04/17  4:47 AM   Result Value Ref Range    WBC 6.8 4.6 - 13.2 K/uL    RBC 4.56 (L) 4.70 - 5.50 M/uL    HGB 14.3 13.0 - 16.0 g/dL    HCT 42.0 36.0 - 48.0 %    MCV 92.1 74.0 - 97.0 FL    MCH 31.4 24.0 - 34.0 PG    MCHC 34.0 31.0 - 37.0 g/dL    RDW 14.2 11.6 - 14.5 %    PLATELET 231 135 - 420 K/uL    MPV 10.3 9.2 - 11.8 FL    NEUTROPHILS 44 40 - 73 %    LYMPHOCYTES 45 21 - 52 %    MONOCYTES 9 3 - 10 %    EOSINOPHILS 2 0 - 5 %    BASOPHILS 0 0 - 2 %    ABS. NEUTROPHILS 3.0 1.8 - 8.0 K/UL    ABS. LYMPHOCYTES 3.1 0.9 - 3.6 K/UL    ABS. MONOCYTES 0.6 0.05 - 1.2 K/UL    ABS. EOSINOPHILS 0.1 0.0 - 0.4 K/UL    ABS. BASOPHILS 0.0 0.0 - 0.1 K/UL    DF AUTOMATED     METABOLIC PANEL, COMPREHENSIVE    Collection Time: 10/04/17  4:47 AM   Result Value Ref Range    Sodium 144 136 - 145 mmol/L    Potassium 4.0 3.5 - 5.5 mmol/L    Chloride 111 (H) 100 - 108 mmol/L    CO2 24 21 - 32 mmol/L    Anion gap 9 3.0 - 18 mmol/L    Glucose 93 74 - 99 mg/dL    BUN 9 7.0 - 18 MG/DL    Creatinine 0.95 0.6 - 1.3 MG/DL    BUN/Creatinine ratio 9 (L) 12 - 20      GFR est AA >60 >60 ml/min/1.63m    GFR est non-AA >60 >60 ml/min/1.735m   Calcium 8.3 (L) 8.5 - 10.1 MG/DL    Bilirubin, total 0.5 0.2 - 1.0 MG/DL    ALT (SGPT) 74 (H) 16 - 61 U/L    AST (SGOT) 63 (H) 15 - 37 U/L    Alk. phosphatase 107 45 - 117 U/L  Protein, total 7.1 6.4 - 8.2 g/dL    Albumin 3.6 3.4 - 5.0 g/dL    Globulin 3.5 2.0 - 4.0 g/dL    A-G Ratio 1.0 0.8 - 1.7     NT-PRO BNP    Collection Time: 10/04/17  4:47 AM   Result Value Ref Range    NT pro-BNP 224 0 - 450 PG/ML   ETHYL ALCOHOL    Collection Time: 10/04/17  4:47 AM   Result Value Ref Range    ALCOHOL(ETHYL),SERUM 208 (H) 0 - 3 MG/DL   TROPONIN I    Collection Time: 10/04/17  4:47 AM   Result Value Ref Range    Troponin-I, QT 0.06 (H) 0.0 - 0.045 NG/ML   POC VENOUS BLOOD GAS    Collection Time: 10/04/17  4:56 AM    Result Value Ref Range    Device: BIPAP      FIO2 (POC) 50 %    pH, venous (POC) 7.324 7.32 - 7.42      pCO2, venous (POC) 49.8 41 - 51 MMHG    pO2, venous (POC) 25 25 - 40 mmHg    HCO3, venous (POC) 26.0 23.0 - 28.0 MMOL/L    sO2, venous (POC) 42 (L) 65 - 88 %    Base excess, venous (POC) 0 mmol/L    PEEP/CPAP (POC) 10 cmH2O    PIP (POC) 19      Pressure support 10 cmH2O    Allens test (POC) YES      Total resp. rate 29      Site RIGHT RADIAL      Patient temp. 97.80      Specimen type (POC) VENOUS BLOOD      Performed by Casilda Carls    EKG, 12 LEAD, INITIAL    Collection Time: 10/04/17  5:02 AM   Result Value Ref Range    Ventricular Rate 101 BPM    Atrial Rate 101 BPM    P-R Interval 144 ms    QRS Duration 106 ms    Q-T Interval 418 ms    QTC Calculation (Bezet) 542 ms    Calculated P Axis 49 degrees    Calculated R Axis 67 degrees    Calculated T Axis 39 degrees    Diagnosis       Sinus tachycardia  Voltage criteria for left ventricular hypertrophy  Nonspecific T wave abnormality  Abnormal ECG  When compared with ECG of 14-Aug-2016 10:10,  Nonspecific T wave abnormality now evident in Lateral leads  QT has lengthened         Imaging:    No results found.  _0 @    EKG at 5:02 AM sinus tachycardic at 101, LVH, QTc prolonged at 542. No STEMI.    CXR (Prelim Read): Pulmonary edema  ED Course/Medical Decision Making   Patient seen immediately on arrival to the ER due to respiratory distress. He was placed on BiPap and Nitro was provided with improvement of his breathing. ED visit from [facility] last week was reviewed. His CTA shows interstitial edema without PE. Patient appears to be in pulmonary edema, but her pro-BNP is only 224. Troponin is slightly elevated at 0.06, but suspect this is from demand and not related to ACS. No STEMI on EKG. No signs of infection or sepsis with a normal WBC, no fever and symptoms atypical for pneumonia. Patient has never had a formal diagnosis of CHF,  but suspect with his uncontrolled HTN, he may have CHF. Patient  requires admission for further evaluation and treatment.    Critical Care Time:  The services I provided to this patient were to treat and/or prevent clinically significant deterioration that could result in the failure of one or more body systems and/or organ systems due to acute pulmonary edema.    Services included the following:  -reviewing nursing notes and old charts  -vital sign assessments  -direct patient care  -medication orders and management  -interpreting and reviewing diagnostic studies/labs  -re-evaluations  -documentation time    Aggregate critical care time was 34 minutes, which includes only time during which I was engaged in work directly related to the patient's care as described above, whether I was at bedside or elsewhere in the Emergency Department. It did not include time spent performing other reported procedures or the services of residents, students, nurses, or advance practice providers.    Filbert Schilder, MD  5:43 AM    Consult:  Discussed care with hospitalist Standard discussion; including history of patient???s chief complaint, available diagnostic results, and treatment course. Will admit.   5:37 AM, 10/04/2017       Medications   nitroglycerin (NITROSTAT) tablet 0.4 mg (0.4 mg SubLINGual Given 10/04/17 0442)   albuterol-ipratropium (DUO-NEB) 2.5 MG-0.5 MG/3 ML (3 mL Nebulization Given 10/04/17 0442)   methylPREDNISolone (PF) (Solu-MEDROL) injection 125 mg (125 mg IntraVENous Given 10/04/17 0442)   nitroglycerin (NITROBID) 2 % ointment 1 Inch (1 Inch Topical Given 10/04/17 0509)       Final Diagnosis       ICD-10-CM ICD-9-CM   1. SOB (shortness of breath) R06.02 786.05   2. Acute pulmonary edema (HCC) J81.0 518.4   3. Elevated troponin R74.8 790.6   4. Prolonged QT interval R94.31 794.31       Disposition   Patient is admitted.       My Medications      ASK your doctor about these medications       Instructions Each Dose to Equal Morning Noon Evening Bedtime   amLODIPine 10 mg tablet  Commonly known as:  NORVASC    Your last dose was:      Your next dose is:          Take 1 Tab by mouth daily.   10 mg                 atorvastatin 20 mg tablet  Commonly known as:  LIPITOR    Your last dose was:      Your next dose is:          Take 1 Tab by mouth daily.   20 mg                 hydroCHLOROthiazide 25 mg tablet  Commonly known as:  HYDRODIURIL    Your last dose was:      Your next dose is:          Take 1 Tab by mouth daily.   25 mg                 losartan 50 mg tablet  Commonly known as:  COZAAR    Your last dose was:      Your next dose is:          TAKE ONE TABLET BY MOUTH TWICE DAILY                  * metoprolol tartrate 50 mg tablet  Commonly known  as:  LOPRESSOR    Your last dose was:      Your next dose is:          Take 1 Tab by mouth two (2) times a day.   50 mg                 * metoprolol tartrate 25 mg tablet  Commonly known as:  LOPRESSOR    Your last dose was:      Your next dose is:          TAKE ONE TABLET BY MOUTH TWICE DAILY                  mupirocin 2 % ointment  Commonly known as:  BACTROBAN    Your last dose was:      Your next dose is:          Apply  to affected area daily.                  traMADol 50 mg tablet  Commonly known as:  ULTRAM    Your last dose was:      Your next dose is:          Take 1 Tab by mouth every six (6) hours as needed for Pain. Max Daily Amount: 200 mg.   50 mg                     * This list has 2 medication(s) that are the same as other medications prescribed for you. Read the directions carefully, and ask your doctor or other care provider to review them with you.                Filbert Schilder, MD  October 04, 2017    My signature above authenticates this document and my orders, the final ??  diagnosis (es), discharge prescription (s), and instructions in the Epic ??  record.  If you have any questions please contact [phone].  ??   Nursing notes have been reviewed by the physician/ advanced practice ??  Clinician.      Scribe Attestation     Filbert Schilder, MD acting as a scribe for and in the presence of Filbert Schilder, MD      October 04, 2017 at 5:49 AM       Provider Attestation:      I personally performed the services described in the documentation, reviewed the documentation, as recorded by the scribe in my presence, and it accurately and completely records my words and actions. October 04, 2017 at 5:49 AM - Filbert Schilder, MD

## 2017-10-04 NOTE — H&P (Signed)
Internal Medicine History and Physical          Subjective     HPI: Brandon Alvarado is a 41 y.o. male who presented to the ED with acute, moderate SOB with onset 2.5 hours PTA. [facility] similar complaints, states he forgot to fill the prednisone Rx. [facility] showed interstitial pulmonary edema on the lungs. Does take his HTN medications. En route to ED, EMS administered duoneb and albuterol treatment. Patient reports he was feeling SOB all day, used a nebulizer and was able to go to sleep. He woke up from sleep SOB. No modifying or aggravating factors were reported. No other concerns or symptoms at this time.    ED evaluation revealed respiratory distress. He was placed on BiPap and Nitro was provided with improvement of his breathing. [facility] was reviewed. His CTA shows interstitial edema without PE. Patient appears to be in pulmonary edema, but her pro-BNP is only 224. Troponin is slightly elevated at 0.06, but suspect this is from demand and not related to ACS. No STEMI on EKG. No signs of infection or sepsis with a normal WBC, no fever and symptoms atypical for pneumonia. Patient has never had a formal diagnosis of CHF, but suspect with his uncontrolled HTN, he may have CHF.    Will admit for further evaluation and treatment            PMHx:  Past Medical History:   Diagnosis Date   ??? Chronic pain     back   ??? GERD (gastroesophageal reflux disease)    ??? Hypertension    ??? Kidney stone        PSurgHx:  Past Surgical History:   Procedure Laterality Date   ??? COLONOSCOPY N/A 06/27/2016    COLON[facility] by Estil Dafthong S Lee, [facility]COPY   ??? HX HEMORRHOIDECTOMY  08/14/2016   ??? HX ORTHOPAEDIC      left ankle, hardware in place       SocialHx:  Social History     Socioeconomic History   ??? Marital status: SINGLE     Spouse name: Not on file   ??? Number of children: Not on file   ??? Years of education: Not on file    ??? Highest education level: Not on file   Social Needs   ??? Financial resource strain: Not on file   ??? Food insecurity - worry: Not on file   ??? Food insecurity - inability: Not on file   ??? Transportation needs - medical: Not on file   ??? Transportation needs - non-medical: Not on file   Occupational History   ??? Not on file   Tobacco Use   ??? Smoking status: Current Every Day Smoker     Packs/day: 1.50     Types: Cigarettes   ??? Smokeless tobacco: Never Used   Substance and Sexual Activity   ??? Alcohol use: Yes     Comment: ocassionally   ??? Drug use: No   ??? Sexual activity: Yes     Partners: Female   Other Topics Concern   ??? Not on file   Social History Narrative   ??? Not on file       Allergies:  Allergies   Allergen Reactions   ??? Lisinopril Other (comments)     Sore throat       FamilyHx:  No family history on file.  Prior to Admission Medications   Prescriptions Last Dose Informant Patient Reported? Taking?   amLODIPine (NORVASC) 10 mg tablet   No No   Sig: Take 1 Tab by mouth daily.   atorvastatin (LIPITOR) 20 mg tablet   No No   Sig: Take 1 Tab by mouth daily.   hydroCHLOROthiazide (HYDRODIURIL) 25 mg tablet   No No   Sig: Take 1 Tab by mouth daily.   losartan (COZAAR) 50 mg tablet   No No   Sig: TAKE ONE TABLET BY MOUTH TWICE DAILY   metoprolol tartrate (LOPRESSOR) 25 mg tablet   No No   Sig: TAKE ONE TABLET BY MOUTH TWICE DAILY   metoprolol tartrate (LOPRESSOR) 50 mg tablet   No No   Sig: Take 1 Tab by mouth two (2) times a day.   mupirocin (BACTROBAN) 2 % ointment   No No   Sig: Apply  to affected area daily.   traMADol (ULTRAM) 50 mg tablet   No No   Sig: Take 1 Tab by mouth every six (6) hours as needed for Pain. Max Daily Amount: 200 mg.      Facility-Administered Medications: None       Review of Systems:  CONST: fever(-),   chills(-),   fatigue(-),   malaise(-)  SKIN: itching(-),   rash(-)  EYES: vision - no change from baseline  ENT: ear pain(-),   nasal discharge(-),   sore throat(-),   voice change(-)   RESP: SOB(+),   cough(-),   hemoptysis(-)  CV: chest pain(-),   PND(+),   Orthopnea(+),   exertional dyspnea(+),   palpitations(-), syncope(-)  GI: abd pain(-),   nausea(-),   vomiting(-),   diarrhea(-),   melena(-),   hematemesis(-), hematochesia(-)  GU: dysuria(-),   frequency(-),   urgency(-),   hematuria(-)  MS: arthralgias(-),   myalgias(-)  NEURO: speech w/o change,   tremors(-),   seizures(-),   numbness(-),   dizziness(-)    Objective      Visit Vitals  BP (!) 173/116   Pulse (!) 104   Temp 98.7 ??F (37.1 ??C)   Resp 25   Ht 5\' 5"  (1.651 m)   Wt 83.9 kg (185 lb)   SpO2 100%   BMI 30.79 kg/m??       Physical Exam:  CONST: better respiratory pattern on BiPAP,   VSS reviewed  SKIN: rashes(-),   ulcers(-)  EYES: PERRL,   EOMI,   sclerae w/o jaundice  HENT: HEENT,   normal appearing nose and ears,   normal nasal mucosa and turbinates  NECK: supple,   no LA,   trachea is midline,   thyroid w/o goiter or palpable nodules  RESP: increased respiratory effort,   wheezes(-),   Rales(+) at bases,   Rhochi(+),   no consolidation on percussion  CHEST: normal axillae,   retractions(-),   use of accessory muscles(-)  CV: JVD(-),   carotid bruits(-),   RRR,   gallops(-),   murmurs(-),   edema LE(-),   abd bruits(-),   peripheral pulses normal  ABD: soft, tender(-),   distended(-),   HSM(-),   BS(+) in all quadrants,   peritoneal signs(-)  MS: NROM in all extremities,  clubbing(-),   peripheral cyanosis(-)  NEURO: speech normal, tremors(-),   CN II-XII(-),   lateralizing signs(-)  PSYCH: AOC x 3,   appropriate affect,   non-suicidal      Laboratory Studies:  All lab results for the last 24 hours reviewed.  Imaging Reviewed:  No results found.        Assessment/Plan     Active Hospital Problems    Diagnosis Date Noted   ??? 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 I reviewed available electronic documentation outlining the initial presentation as well as the emergency room physician's encounter.    Total time spent with patient and chart review, orders and documentation - out of which more than 50% spent face-to-face with patient.    Willodean Rosenthal, MD  10/04/2017   6:25 AM      [facility]

## 2017-10-04 NOTE — ED Notes (Signed)
Patient standing at side of bed, SOB, sweating, tripoding and stated he can't breath, patient returned to BiPAP

## 2017-10-04 NOTE — ED Notes (Signed)
Patient doing well off of bipap. Sitting up on the stretcher talking on the telephone

## 2017-10-04 NOTE — ED Notes (Signed)
Patient remains asleep   No distress.   Continue to monitor

## 2017-10-04 NOTE — ED Notes (Signed)
Respiratory at bedside applying BiPaP

## 2017-10-04 NOTE — ED Notes (Signed)
Patient updated on plan of care. Explained to patient the need for him to be in a stepdown bed vs. Medical bed. Patient concerned with not being able to eat or drink with bipap mask on.

## 2017-10-04 NOTE — Progress Notes (Signed)
Patient seen by Nocturnist early this am:       Flash pulmonary edema    Will stop nitro, start home medications, bumex added IV daily.  Resp to attempt to get off bipap.  If can be stable off bipap for a couple hrs can go to tele floor.

## 2017-10-04 NOTE — ED Notes (Signed)
Nitro paste removed from chest.  Nitro patch applied to right deltoid

## 2017-10-04 NOTE — ED Notes (Signed)
X-ray being performed at bedside.

## 2017-10-04 NOTE — ED Notes (Signed)
Echo at bedside

## 2017-10-04 NOTE — ACP (Advance Care Planning) (Signed)
Chaplain Spoke with Patient at 1120 regarding AMD.  He requested we make Brandon Alvarado, Pt's Uncle,  his primary agent/ [employer] [phone]) and keep Brandon Alvarado the secondary contact.  I provided Pt with the forms needed to complete the ACP upon admission.

## 2017-10-05 ENCOUNTER — Inpatient Hospital Stay: Payer: Self-pay | Primary: Nurse Practitioner

## 2017-10-05 LAB — METABOLIC PANEL, COMPREHENSIVE
A-G Ratio: 1 (ref 0.8–1.7)
ALT (SGPT): 64 U/L — ABNORMAL HIGH (ref 16–61)
AST (SGOT): 42 U/L — ABNORMAL HIGH (ref 15–37)
Albumin: 3.4 g/dL (ref 3.4–5.0)
Alk. phosphatase: 104 U/L (ref 45–117)
Anion gap: 7 mmol/L (ref 3.0–18)
BUN/Creatinine ratio: 19 (ref 12–20)
BUN: 21 MG/DL — ABNORMAL HIGH (ref 7.0–18)
Bilirubin, total: 1.1 MG/DL — ABNORMAL HIGH (ref 0.2–1.0)
CO2: 29 mmol/L (ref 21–32)
Calcium: 8.6 MG/DL (ref 8.5–10.1)
Chloride: 105 mmol/L (ref 100–108)
Creatinine: 1.09 MG/DL (ref 0.6–1.3)
GFR est AA: >60 mL/min/{1.73_m2} (ref >60)
GFR est non-AA: >60 mL/min/{1.73_m2} (ref >60)
Globulin: 3.3 g/dL (ref 2.0–4.0)
Glucose: 96 mg/dL (ref 74–99)
Potassium: 3.6 mmol/L (ref 3.5–5.5)
Protein, total: 6.7 g/dL (ref 6.4–8.2)
Sodium: 141 mmol/L (ref 136–145)

## 2017-10-05 LAB — CBC W/O DIFF
HCT: 40.8 % (ref 36.0–48.0)
HGB: 13.6 g/dL (ref 13.0–16.0)
MCH: 30.6 PG (ref 24.0–34.0)
MCHC: 33.3 g/dL (ref 31.0–37.0)
MCV: 91.7 FL (ref 74.0–97.0)
MPV: 10.7 FL (ref 9.2–11.8)
PLATELET: 223 10*3/uL (ref 135–420)
RBC: 4.45 M/uL — ABNORMAL LOW (ref 4.70–5.50)
RDW: 14.2 % (ref 11.6–14.5)
WBC: 6.3 10*3/uL (ref 4.6–13.2)

## 2017-10-05 NOTE — Discharge Summary (Signed)
Internal Medicine Discharge Summary      Patient: Brandon Alvarado    Date of Birth:  11/29/1975    Age:  41 y.o.                      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  NTG patch and consider drip if necessary  Echo to evaluate for possible cardiomyopathy as heart seems enlarged on CXR             Hypertensive emergency ICD-10-CM: I16.1  ICD-9-CM: 401.9  10/04/2017 - Present        Internal and external prolapsed hemorrhoids ICD-10-CM: K64.8  ICD-9-CM: 455.2, 455.5  07/28/2016 - Present        Rectal bleeding ICD-10-CM: K62.5  ICD-9-CM: 569.3  06/02/2016 - Present        History of kidney stones ICD-10-CM: Z87.442  ICD-9-CM: V13.01  05/26/2016 - Present        Essential hypertension ICD-10-CM: I10  ICD-9-CM: 401.9  05/26/2016 - Present        Inguinal hernia of left side without obstruction or gangrene ICD-10-CM: K40.90  ICD-9-CM: 550.90  03/13/2016 - Present              Discharge Condition:  Left AMA    Procedures: None         Hospital Course: Brandon Alvarado is a 41 y.o. male who presented to the ED with acute, moderate SOB with onset 2.5 hours PTA. Patient was [facility] at [facility] this [facility]??for similar complaints, states he forgot to fill the??prednisone Rx. CTA[facility]at [facility] showed interstitial pulmonary edema??on the lungs. Does take his HTN medications. En route to ED, EMS administered duoneb and albuterol treatment. Patient reports he was feeling SOB all day, used a nebulizer and was able to go to sleep. He woke up from sleep  SOB. No modifying or aggravating factors were reported.??No other concerns or symptoms at this time. ED evaluation revealed respiratory distress. He was placed on BiPap and Nitro was provided with improvement of his breathing. [facility] was reviewed. His CTA shows interstitial edema without PE. Patient appears to be in pulmonary edema, but her pro-BNP is only 224. Troponin is slightly elevated at 0.06, but suspect this is from demand and not related to ACS.??No STEMI on EKG.??No signs of infection or sepsis with a normal WBC, no fever and symptoms atypical for pneumonia. Patient has never had a formal diagnosis of CHF, but suspect with his uncontrolled HTN, he may have??CHF. The patient was placed on a cardene drip and BP monitored closely. Unfortunately, the patient decided to leave AMA before being appropriately worked up.    Visit Vitals  BP 144/82   Pulse (!) 101   Temp 98.7 ??F (37.1 ??C)   Resp 19   Ht 5\' 5"  (1.651 m)   Wt 83.9 kg (185 lb)  SpO2 97%   BMI 30.79 kg/m??       Labs Prior to Discharge:  Labs: Results:       Chemistry Recent Labs     10/05/17  0400 10/04/17  0447   GLU 96 93   NA 141 144   K 3.6 4.0   CL 105 111*   CO2 29 24   BUN 21* 9   CREA 1.09 0.95   CA 8.6 8.3*   AGAP 7 9   BUCR 19 9*   AP 104 107   TP 6.7 7.1   ALB 3.4 3.6   GLOB 3.3 3.5   AGRAT 1.0 1.0      CBC w/Diff Recent Labs     10/05/17  0458 10/04/17  0447   WBC 6.3 6.8   RBC 4.45* 4.56*   HGB 13.6 14.3   HCT 40.8 42.0   PLT 223 231   GRANS  --  44   LYMPH  --  45   EOS  --  2      Cardiac Enzymes No results for input(s): CPK, CKND1, MYO in the last 72 hours.    No lab exists for component: CKRMB, TROIP   Coagulation No results for input(s): PTP, INR, APTT in the last 72 hours.    No lab exists for component: INREXT    Lipid Panel Lab Results   Component Value Date/Time    Cholesterol, total 247 (H) 08/27/2016 02:44 PM    HDL Cholesterol 34 (L) 08/27/2016 02:44 PM    LDL, calculated 168 (H) 08/27/2016 02:44 PM     VLDL, calculated 45 (H) 08/27/2016 02:44 PM    Triglyceride 224 (H) 08/27/2016 02:44 PM      BNP No results for input(s): BNPP in the last 72 hours.   Liver Enzymes Recent Labs     10/05/17  0400   TP 6.7   ALB 3.4   AP 104   SGOT 42*      Thyroid Studies No results found for: T4, T3U, TSH, TSHEXT         Significant Imaging:  Xr Chest Port    Result Date: 10/04/2017  EXAM: Portable frontal view of the chest. CLINICAL INDICATION/HISTORY: Shortness of breath COMPARISON: None _______________ FINDINGS: The heart is enlarged. Vascular and interstitial congestion identified. No focal consolidation, effusion, or pneumothorax. _______________     IMPRESSION: 1. Cardiomegaly with vascular and interstitial congestion.           Patient left AMA         Total time spent including time spent on discharge documentation and records reviewed: < 30 minutes    Sandra Cockaynehristopher Niguel Moure, DO  Internal Medicine, Hospitalist  Pager: [phone]  [facility]

## 2017-10-05 NOTE — ED Notes (Signed)
Patient stating he is leaving, and demands all treatment be stopped. This nurse spoke with hospitalist, and he recommended stopping cardene drip and monitoring for stability, otherwise patient would have to leave AMA. Patient agrees to wait a little while longer for discharge. Cardene stopped per patient request.

## 2017-10-05 NOTE — ED Notes (Signed)
Spoke with Dr. Keane ScrapeNanul and made him aware of patient's decision to leave AMA. Requested by MD to have patient sign AMA form.

## 2017-10-05 NOTE — ED Notes (Signed)
Patient refusing chest xray. Explained to patient the risks of him leaving and patient states that he understands but states that he needs to leave before his ride goes to work.

## 2017-10-05 NOTE — Discharge Summary (Signed)
Discharge Summary by Melburn PopperWebb, Zaylah Blecha H, DO at 10/05/17 [id number]                Author: Melburn PopperWebb, Oumou Smead H, DO  Service: Internal Medicine  Author Type: Physician       Filed: 10/05/17 0709  Date of Service: 10/05/17 0637  Status: Signed          Editor: Melburn PopperWebb, Rashiya Lofland H, DO (Physician)                    Internal Medicine Discharge Summary            Patient: Brandon Alvarado      Date of Birth:  10/14/1975      Age:  41 y.o.                              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   Continue BiPAP   NTG patch and consider drip if necessary   Echo to evaluate for possible cardiomyopathy as heart seems enlarged on CXR                                   Hypertensive emergency  ICD-10-CM: I16.1   ICD-9-CM: 401.9    10/04/2017 - Present                       Internal and external prolapsed hemorrhoids  ICD-10-CM: K64.8   ICD-9-CM: 455.2, 455.5    07/28/2016 - Present                       Rectal bleeding  ICD-10-CM: K62.5   ICD-9-CM: 569.3    06/02/2016 - Present                       History of kidney stones  ICD-10-CM: Z87.442   ICD-9-CM: V13.01    05/26/2016 - Present                       Essential hypertension  ICD-10-CM: I10   ICD-9-CM: 401.9    05/26/2016 - Present                       Inguinal hernia of left side without obstruction or gangrene  ICD-10-CM: K40.90   ICD-9-CM: 550.90    03/13/2016 - Present  Discharge Condition:  Left AMA      Procedures: None                 Hospital Course: Brandon Alvarado is a 41 y.o. male who presented to the ED with acute, moderate SOB with onset 2.5 hours PTA. Patient was  seen at [facility] earlier this week??for similar complaints, states he forgot to fill the??prednisone Rx. CTA??at [facility] showed interstitial pulmonary edema??on the lungs. Does take his HTN medications. En route to ED, EMS administered duoneb and  albuterol treatment. Patient reports he was feeling SOB all day, used a nebulizer and was able to go to sleep. He woke up from sleep SOB. No modifying or aggravating factors were reported.??No other concerns or symptoms at this time. ED evaluation revealed respiratory  distress. He was placed on BiPap and Nitro was provided with improvement of his breathing. ED visit from [facility] last week was reviewed. His CTA shows interstitial edema without PE. Patient appears to be in pulmonary edema, but her pro-BNP is only 224.  Troponin is slightly elevated at 0.06, but suspect this is from demand and not related to ACS.??No STEMI on EKG.??No signs of infection or sepsis with a normal WBC, no fever and symptoms atypical for pneumonia. Patient has never had a formal diagnosis  of CHF, but suspect with his uncontrolled HTN, he may have??CHF. The patient was placed on a cardene drip and BP monitored closely. Unfortunately, the patient decided to leave AMA before being appropriately worked up.      Visit Vitals      BP  144/82     Pulse  (!) 101     Temp  98.7 ??F (37.1 ??C)     Resp  19     Ht  5\' 5"  (1.651 m)     Wt  83.9 kg (185 lb)     SpO2  97%        BMI  30.79 kg/m??           Labs Prior to Discharge:      Labs:  Results:                  Chemistry  Recent Labs         10/05/17   0400  10/04/17   0447      GLU  96  93      NA  141  144      K  3.6  4.0      CL  105  111*      CO2  29  24      BUN  21*  9      CREA  1.09  0.95      CA  8.6  8.3*      AGAP  7  9      BUCR  19  9*      AP  104  107      TP  6.7  7.1      ALB  3.4  3.6      GLOB  3.3  3.5       AGRAT  1.0  1.0                 CBC w/Diff  Recent Labs         10/05/17   0458  10/04/17   0447      WBC  6.3  6.8  RBC  4.45*  4.56*      HGB  13.6  14.3      HCT  40.8  42.0      PLT  223  231      GRANS   --   44      LYMPH   --   45      EOS   --   2                 Cardiac Enzymes  No results for input(s): CPK, CKND1, MYO in the last 72 hours.      No lab exists for component: CKRMB, TROIP     Coagulation  No results for input(s): PTP, INR, APTT in the last 72 hours.      No lab exists for component: INREXT         Lipid Panel  Lab Results      Component  Value  Date/Time        Cholesterol, total  247 (H)  08/27/2016 02:44 PM        HDL Cholesterol  34 (L)  08/27/2016 02:44 PM        LDL, calculated  168 (H)  16/10/960411/15/2017 02:44 PM        VLDL, calculated  45 (H)  08/27/2016 02:44 PM        Triglyceride  224 (H)  08/27/2016 02:44 PM                 BNP  No results for input(s): BNPP in the last 72 hours.        Liver Enzymes  Recent Labs         10/05/17   0400      TP  6.7      ALB  3.4      AP  104      SGOT  42*                 Thyroid Studies  No results found for: T4, T3U, TSH, TSHEXT             Significant Imaging:   Xr Chest Port      Result Date: 10/04/2017   EXAM: Portable frontal view of the chest. CLINICAL INDICATION/HISTORY: Shortness of breath COMPARISON: None _______________ FINDINGS: The heart is enlarged. Vascular and interstitial congestion identified. No focal consolidation, effusion, or pneumothorax.  _______________       IMPRESSION: 1. Cardiomegaly with vascular and interstitial congestion.                   Patient left AMA                Total time spent including time spent on discharge documentation and records reviewed: < 30 minutes      Sandra Cockaynehristopher Mykaylah Ballman, DO   Internal Medicine, Hospitalist   Pager: [phone]   [facility]

## 2017-10-29 ENCOUNTER — Encounter: Attending: Internal Medicine | Primary: Nurse Practitioner

## 2017-12-07 ENCOUNTER — Encounter

## 2017-12-07 ENCOUNTER — Inpatient Hospital Stay: Admit: 2017-12-07 | Payer: BLUE CROSS/BLUE SHIELD | Primary: Nurse Practitioner

## 2017-12-07 ENCOUNTER — Ambulatory Visit
Admit: 2017-12-07 | Discharge: 2017-12-07 | Payer: PRIVATE HEALTH INSURANCE | Attending: Nurse Practitioner | Primary: Nurse Practitioner

## 2017-12-07 DIAGNOSIS — R0602 Shortness of breath: Secondary | ICD-10-CM

## 2017-12-07 MED ORDER — ALBUTEROL SULFATE 0.083 % (0.83 MG/ML) SOLN FOR INHALATION
2.5 mg /3 mL (0.083 %) | Freq: Once | RESPIRATORY_TRACT | 0 refills | Status: DC
Start: 2017-12-07 — End: 2017-12-07

## 2017-12-07 MED ORDER — AMLODIPINE 10 MG TAB
10 mg | ORAL_TABLET | Freq: Every day | ORAL | 0 refills | Status: DC
Start: 2017-12-07 — End: 2018-01-06

## 2017-12-07 MED ORDER — HYDROCHLOROTHIAZIDE 25 MG TAB
25 mg | ORAL_TABLET | Freq: Every day | ORAL | 0 refills | Status: DC
Start: 2017-12-07 — End: 2018-01-06

## 2017-12-07 MED ORDER — LOSARTAN 50 MG TAB
50 mg | ORAL_TABLET | Freq: Two times a day (BID) | ORAL | 0 refills | Status: DC
Start: 2017-12-07 — End: 2018-01-06

## 2017-12-07 MED ORDER — PREDNISONE 5 MG TABLETS IN A DOSE PACK
5 mg | ORAL_TABLET | ORAL | 0 refills | Status: DC
Start: 2017-12-07 — End: 2017-12-17

## 2017-12-07 MED ORDER — ALBUTEROL SULFATE HFA 90 MCG/ACTUATION AEROSOL INHALER
90 mcg/actuation | RESPIRATORY_TRACT | 3 refills | Status: AC | PRN
Start: 2017-12-07 — End: ?

## 2017-12-07 MED ORDER — IPRATROPIUM-ALBUTEROL 2.5 MG-0.5 MG/3 ML NEB SOLUTION
2.5 mg-0.5 mg/3 ml | RESPIRATORY_TRACT | 0 refills | Status: AC
Start: 2017-12-07 — End: 2017-12-07

## 2017-12-07 NOTE — Patient Instructions (Signed)
Pulmonary Edema: Care Instructions  Your Care Instructions    Pulmonary edema is the buildup of fluid in the lungs. It usually occurs when the heart does not pump blood through the body properly. Pulmonary edema can also be caused by another disease, such as liver or kidney failure. It can also happen at high altitudes, from a poisoning, or as a result of a near-drowning.  If you have fluid in your lungs, you may have trouble breathing, be restless, have a fast heart rate, or cough up foamy pink fluid. Breathing problems may be worse when you lie down.  Follow-up care is a key part of your treatment and safety. Be sure to make and go to all appointments, and call your doctor if you are having problems. It's also a good idea to know your test results and keep a list of the medicines you take.  How can you care for yourself at home?  Medicines  ?? ?? Take your medicines exactly as prescribed. Call your doctor if you think you are having a problem with your medicine.   ?? ?? Review all of your regular medicines with your doctor. Do not take any vitamins, over-the-counter medicines, or herbal products without talking to your doctor first.   Diet  ?? ?? Eat a balanced diet. Make an appointment with a dietitian if you have questions about what type of diet might be best for you.   ?? ?? Do not eat more than 2,000 milligrams (mg) of sodium each day. That is less than 1 teaspoon of salt a day, including all the salt you eat in prepared or packaged foods.  ? Do not add salt while you are cooking or at the table. Flavor with garlic, lemon juice, onion, vinegar, herbs, and spices instead of salt.  ? Eat fewer processed foods and foods from restaurants, including fast food.  ? Use fresh or frozen foods instead of canned.  ? Count and record how much sodium you eat each day. Check food labels for sodium.  ? Ask your doctor before using salt substitutes that have potassium, such as Lite Salt.   ??Lifestyle   ?? ?? Stay out of air pollution; smog; cold, dry air; hot, humid air; and high altitudes.   ?? ?? Learn breathing methods that help the airflow in and out of your lungs.   ?? ?? Take rest breaks often. Schedule short rest breaks when doing housework and other activities. An occupational or physical therapist can help you find ways to do everyday activities with less effort.   ?? ?? Start light exercise if your doctor says it is okay. Try to stay as active as possible. If you have not exercised in the past, start out slowly. Walking is a good way to start.   ?? ?? Get enough rest at night. Sleeping with 1 or 2 pillows under your upper body and head may help you breathe easier at night.   ?? ?? Discuss rehabilitation with your doctor. Find out what programs are available in your area.   ?? ?? Do not smoke or use other tobacco products. Smoking can make your condition worse. If you need help quitting, talk to your doctor about stop-smoking programs and medicines. These can increase your chances of quitting for good.   ?? ?? Do not use alcohol or illegal drugs.   When should you call for help?  Call 911 anytime you think you may need emergency care. For example, call if:  ?? ??   You have severe trouble breathing.   ?? ?? You passed out (lost consciousness).   ?? ?? You have symptoms of a heart attack. These may include:  ? Chest pain or pressure, or a strange feeling in the chest.  ? Sweating.  ? Shortness of breath.  ? Nausea or vomiting.  ? Pain, pressure, or a strange feeling in the back, neck, jaw, or upper belly or in one or both shoulders or arms.  ? Lightheadedness or sudden weakness.  ? A fast or irregular heartbeat. Pain that spreads from the chest to the neck, jaw, or one or both shoulders or arms.   ??After you call 911, the operator may tell you to chew 1 adult-strength or 2 to 4 low-dose aspirin. Wait for an ambulance. Do not try to drive yourself.  ??Call your doctor now or seek immediate medical care if:   ?? ?? You have trouble breathing or have wheezing that is getting worse.   ?? ?? You are coughing more deeply or more often.   ?? ?? You cough up blood.   ?? ?? You get a fever.   ?? ?? You have more swelling in your legs or belly.   ?? ?? Your symptoms are getting worse.   ??Watch closely for changes in your health, and be sure to contact your doctor if you have any problems.  Where can you learn more?  Go to [employer].  Enter [phone] in the search box to learn more about "Pulmonary Edema: Care Instructions."  Current as of: June 17, 2017  Content Version: 11.9  ?? 2006-2018 [employer]. Care instructions adapted under license by [employer] (which disclaims liability or warranty for this information). If you have questions about a medical condition or this instruction, always ask your healthcare professional. [employer] disclaims any warranty or liability for your use of this information.         High Blood Pressure: Care Instructions  Overview    It's normal for blood pressure to go up and down throughout the day. But if it stays up, you have high blood pressure. Another name for high blood pressure is hypertension.  Despite what a lot of people think, high blood pressure usually doesn't cause headaches or make you feel dizzy or lightheaded. It usually has no symptoms. But it does increase your risk of stroke, heart attack, and other problems. You and your doctor will talk about your risks of these problems based on your blood pressure.  Your doctor will give you a goal for your blood pressure. Your goal will be based on your health and your age.  Lifestyle changes, such as eating healthy and being active, are always important to help lower blood pressure. You might also take medicine to reach your blood pressure goal.  Follow-up care is a key part of your treatment and safety. Be sure to make  and go to all appointments, and call your doctor if you are having problems. It's also a good idea to know your test results and keep a list of the medicines you take.  How can you care for yourself at home?  Medical treatment  ?? If you stop taking your medicine, your blood pressure will go back up. You may take one or more types of medicine to lower your blood pressure. Be safe with medicines. Take your medicine exactly as prescribed. Call your doctor if you think you are having a problem with your medicine.  ?? Talk to  your doctor before you start taking aspirin every day. Aspirin can help certain people lower their risk of a heart attack or stroke. But taking aspirin isn't right for everyone, because it can cause serious bleeding.  ?? See your doctor regularly. You may need to see the doctor more often at first or until your blood pressure comes down.  ?? If you are taking blood pressure medicine, talk to your doctor before you take decongestants or anti-inflammatory medicine, such as ibuprofen. Some of these medicines can raise blood pressure.  ?? Learn how to check your blood pressure at home.  Lifestyle changes  ?? Stay at a healthy weight. This is especially important if you put on weight around the waist. Losing even 10 pounds can help you lower your blood pressure.  ?? If your doctor recommends it, get more exercise. Walking is a good choice. Bit by bit, increase the amount you walk every day. Try for at least 30 minutes on most days of the week. You also may want to swim, bike, or do other activities.  ?? Avoid or limit alcohol. Talk to your doctor about whether you can drink any alcohol.  ?? Try to limit how much sodium you eat to less than 2,300 milligrams (mg) a day. Your doctor may ask you to try to eat less than 1,500 mg a day.  ?? Eat plenty of fruits (such as bananas and oranges), vegetables, legumes, whole grains, and low-fat dairy products.   ?? Lower the amount of saturated fat in your diet. Saturated fat is found in animal products such as milk, cheese, and meat. Limiting these foods may help you lose weight and also lower your risk for heart disease.  ?? Do not smoke. Smoking increases your risk for heart attack and stroke. If you need help quitting, talk to your doctor about stop-smoking programs and medicines. These can increase your chances of quitting for good.  When should you call for help?  Call 911 anytime you think you may need emergency care. This may mean having symptoms that suggest that your blood pressure is causing a serious heart or blood vessel problem. Your blood pressure may be over 180/120.  ??For example, call 911 if:  ?? ?? You have symptoms of a heart attack. These may include:  ? Chest pain or pressure, or a strange feeling in the chest.  ? Sweating.  ? Shortness of breath.  ? Nausea or vomiting.  ? Pain, pressure, or a strange feeling in the back, neck, jaw, or upper belly or in one or both shoulders or arms.  ? Lightheadedness or sudden weakness.  ? A fast or irregular heartbeat.   ?? ?? You have symptoms of a stroke. These may include:  ? Sudden numbness, tingling, weakness, or loss of movement in your face, arm, or leg, especially on only one side of your body.  ? Sudden vision changes.  ? Sudden trouble speaking.  ? Sudden confusion or trouble understanding simple statements.  ? Sudden problems with walking or balance.  ? A sudden, severe headache that is different from past headaches.   ?? ?? You have severe back or belly pain.   ??Do not wait until your blood pressure comes down on its own. Get help right away.  ??Call your doctor now or seek immediate care if:  ?? ?? Your blood pressure is much higher than normal (such as 180/120 or higher), but you don't have symptoms.   ?? ?? You think high  blood pressure is causing symptoms, such as:  ? Severe headache.  ? Blurry vision.    ??Watch closely for changes in your health, and be sure to contact your doctor if:  ?? ?? Your blood pressure measures higher than your doctor recommends at least 2 times. That means the top number is higher or the bottom number is higher, or both.   ?? ?? You think you may be having side effects from your blood pressure medicine.   Where can you learn more?  Go to [employer].  Enter [phone] in the search box to learn more about "High Blood Pressure: Care Instructions."  Current as of: May 03, 2017  Content Version: 11.9  ?? 2006-2018 [employer]. Care instructions adapted under license by [employer] (which disclaims liability or warranty for this information). If you have questions about a medical condition or this instruction, always ask your healthcare professional. [employer] disclaims any warranty or liability for your use of this information.

## 2017-12-07 NOTE — Progress Notes (Signed)
Brandon Alvarado is a 42 y.o. African American male and presents with    Chief Complaint   Patient presents with   ??? Cough   ??? Hypertension       Subjective:  Brandon Alvarado presents today for routine care and to re-establish care. He has not been seen in the office since 08/2016. He was seen at [facility] ED on 10/04/17 with complaints of shortness of breath and sputum production. He was being worked up in the ED but was unable to wait and left AMA. He was seen at [facility] on 12/17 for the same symptoms. He was discharged with medication but states he did not fill them because he was unsure of what medications were prescribed and why.    Today, he denies fever and chills. He reports sputum production and difficulty laying flat. He has shortness of breath when walking long distances. He reports intermittent wheezing. He denies chest pain and pain in on inspiration. He is currently sleeping in a recliner. Symptoms have been present since December. He has not taken any blood pressure medication in over a year. He states he was home in [place], [place] for a year and did not have primary care.  Cardiovascular Review:  The patient has hypertension.  Diet and Lifestyle: smoker 1 PPD  Home BP Monitoring: is not measured at home.  Pertinent ROS: not taking medications regularly as instructed, noting some chest pains described as pressure when coughing, notes stable dyspnea on exertion, no change.     Additional Concerns: Brandon Alvarado is here to establish care.       Patient Active Problem List   Diagnosis Code   ??? Inguinal hernia of left side without obstruction or gangrene K40.90   ??? History of kidney stones Z87.442   ??? Essential hypertension I10   ??? Rectal bleeding K62.5   ??? Internal and external prolapsed hemorrhoids K64.8   ??? Acute pulmonary edema (HCC) J81.0   ??? Hypertensive emergency I16.1     Current Outpatient Medications   Medication Sig Dispense Refill    ??? losartan (COZAAR) 50 mg tablet TAKE ONE TABLET BY MOUTH TWICE DAILY 60 Tab 1   ??? metoprolol tartrate (LOPRESSOR) 25 mg tablet TAKE ONE TABLET BY MOUTH TWICE DAILY 60 Tab 1   ??? atorvastatin (LIPITOR) 20 mg tablet Take 1 Tab by mouth daily. 30 Tab 3   ??? mupirocin (BACTROBAN) 2 % ointment Apply  to affected area daily. 22 g 3   ??? amLODIPine (NORVASC) 10 mg tablet Take 1 Tab by mouth daily. 30 Tab 3   ??? traMADol (ULTRAM) 50 mg tablet Take 1 Tab by mouth every six (6) hours as needed for Pain. Max Daily Amount: 200 mg. 20 Tab 0   ??? metoprolol tartrate (LOPRESSOR) 50 mg tablet Take 1 Tab by mouth two (2) times a day. 60 Tab 3   ??? hydroCHLOROthiazide (HYDRODIURIL) 25 mg tablet Take 1 Tab by mouth daily. 30 Tab 3     Allergies   Allergen Reactions   ??? Lisinopril Other (comments)     Sore throat     Past Medical History:   Diagnosis Date   ??? Chronic pain     back   ??? GERD (gastroesophageal reflux disease)    ??? Hypertension    ??? Kidney stone      Past Surgical History:   Procedure Laterality Date   ??? COLONOSCOPY N/A 06/27/2016    COLONOSCOPY performed by Donaciano Eva, MD at  [facility]   ??? HX GI      hemorrhoidectomy   ??? HX HEMORRHOIDECTOMY  08/14/2016   ??? HX ORTHOPAEDIC      left ankle, hardware in place     History reviewed. No pertinent family history.  Social History     Tobacco Use   ??? Smoking status: Current Every Day Smoker     Packs/day: 1.00     Years: 28.00     Pack years: 28.00     Types: Cigarettes   ??? Smokeless tobacco: Current User   Substance Use Topics   ??? Alcohol use: Yes     Comment: ocassionally       ROS   History obtained from the patient  General ROS: negative for - chills or fever  Respiratory ROS: positive for - cough, shortness of breath and wheezing  Cardiovascular ROS: positive for - dyspnea on exertion  Gastrointestinal ROS: no abdominal pain, change in bowel habits, or black or bloody stools  Genito-Urinary ROS: no dysuria, trouble voiding, or hematuria  Dermatological ROS: negative for - rash     All other systems reviewed and are negative.    Objective:  Vitals:    12/07/17 0908 12/07/17 0916   BP: (!) 167/122 (!) 170/124   Pulse: 96    Resp: 18    Temp: 97.7 ??F (36.5 ??C)    TempSrc: Oral    SpO2: 97%    Weight: 202 lb (91.6 kg)    Height: 5' 5"  (1.651 m)    PainSc:   0 - No pain        PE  General appearance - alert, well appearing, and in no distress and overweight  Mental status - normal mood, behavior, speech, dress, motor activity, and thought processes  Neck - supple, no significant adenopathy, thyroid exam: thyroid is normal in size without nodules or tenderness  Chest - decreased air entry noted throughout all lung fields; after in office duo neb noted air exchange and wheezing to left upper lung  Heart - normal rate and regular rhythm  Abdomen - soft, nontender, nondistended, no masses or organomegaly  Extremities - peripheral pulses normal, no pedal edema, no clubbing or cyanosis  Skin - normal coloration and turgor, no rashes, no suspicious skin lesions noted      LABS   Lab Results   Component Value Date/Time    WBC 6.3 10/05/2017 04:58 AM    HGB 13.6 10/05/2017 04:58 AM    HCT 40.8 10/05/2017 04:58 AM    PLATELET 223 10/05/2017 04:58 AM    MCV 91.7 10/05/2017 04:58 AM     Lab Results   Component Value Date/Time    Troponin-I, QT 0.03 10/04/2017 04:40 PM      Lab Results   Component Value Date/Time    Sodium 141 10/05/2017 04:00 AM    Potassium 3.6 10/05/2017 04:00 AM    Chloride 105 10/05/2017 04:00 AM    CO2 29 10/05/2017 04:00 AM    Anion gap 7 10/05/2017 04:00 AM    Glucose 96 10/05/2017 04:00 AM    BUN 21 (H) 10/05/2017 04:00 AM    Creatinine 1.09 10/05/2017 04:00 AM    BUN/Creatinine ratio 19 10/05/2017 04:00 AM    GFR est AA >60 10/05/2017 04:00 AM    GFR est non-AA >60 10/05/2017 04:00 AM    Calcium 8.6 10/05/2017 04:00 AM    Bilirubin, total 1.1 (H) 10/05/2017 04:00 AM    ALT (SGPT) 64 (  H) 10/05/2017 04:00 AM    AST (SGOT) 42 (H) 10/05/2017 04:00 AM     Alk. phosphatase 104 10/05/2017 04:00 AM    Protein, total 6.7 10/05/2017 04:00 AM    Albumin 3.4 10/05/2017 04:00 AM    Globulin 3.3 10/05/2017 04:00 AM    A-G Ratio 1.0 10/05/2017 04:00 AM        TESTS  Chest Xray 10/04/17: IMPRESSION:  ??  1. Cardiomegaly with vascular and interstitial congestion.    EKG 10/04/17:   Diagnosis   Final   Sinus tachycardia   Voltage criteria for left ventricular hypertrophy   Nonspecific T wave abnormality   Abnormal ECG   When compared with ECG of 14-Aug-2016 10:10,   Nonspecific T wave abnormality now evident in Lateral leads   QT has lengthened   Confirmed by Berna Spare MD, --- ([id number]) on 10/04/2017 10:02:44 AM          CT CTA CHEST PULMONARY12/17/2018  [facility]  Result Impression   Thyroid nodules; unless previously evaluated elsewhere thyroid ultrasound should be electively obtained  Interstitial pulmonary edema.  Negative for pulmonary embolism or other acute vascular pathology  Fatty infiltration of the liver    Thank you for enabling [place] to participate in the care of this patient.    Signed By: Johny Blamer, MD on 09/28/2017 1:33 PM       Assessment/Plan:    1. SOB/DOE- since 09/2017; in office duo neb (patient reports improvement in breathing); has not followed up with PCP; imaging from 09/2017 showed pulmonary edema- may be a results of uncontrolled HTN; repeat Chest xray today; prednisone taper; albuterol for SOB and wheezing; return to office in 3 days for follow up symptoms    2. Uncontrolled HTN- has not been on medication > 1 year; may be precipitating current symptoms; refill on Losartan, Amlodipine, and HCTZ; return to office in 3 days for BP check    3. Thyroid Nodule- incidentally found on CTA chest 09/2017; thyroid US for further evaluation; TSH and free T4    4. Tobacco Abuse- The patient was counseled on the dangers of tobacco use, and was advised to quit.  Reviewed strategies to maximize success,  including removing cigarettes and smoking materials from environment, stress management and total time spent in discussion: 4 minutes.    5. Hypercholesterolemia- lipid panel today    Lab review: orders written for new lab studies as appropriate; see orders      Today's Visit: Chest Xray, In office Duo Neb, CBC, CMP, TSH and Free T4, Troponin, pro-BNP, Lipid Panel, Referral to Cardio, Referral to Pulmonary, Albuterol, Losartan, Amlodipine, HCTZ, Prednisone taper, Thyroid US    Health Maintenance:   Will discuss at next office visit    I have discussed the diagnosis with the patient and the intended plan as seen in the above orders.  The patient has received an after-visit summary and questions were answered concerning future plans.  I have discussed medication side effects and warnings with the patient as well. I have reviewed the plan of care with the patient, accepted their input and they are in agreement with the treatment goals.       Follow-up Disposition:  Return in about 3 days (around 12/10/2017) for follow up SOB, HTN.  More than 1/2 of this 25 minute visit was spent in counseling and coordination of care, as described above.    Warner Mccreedy, FNP-C

## 2017-12-07 NOTE — Progress Notes (Signed)
Brandon Alvarado is a 42 y.o. male  Chief Complaint   Patient presents with   ??? Cough   ??? Hypertension     1. Have you been to the ER, urgent care clinic since your last visit?  Hospitalized since your last visit?No    2. Have you seen or consulted any other health care providers outside of the [facility] since your last visit?  Include any pap smears or colon screening. No

## 2017-12-08 LAB — CBC W/O DIFF
HCT: 43.9 % (ref 37.5–51.0)
HGB: 15.1 g/dL (ref 13.0–17.7)
MCH: 31.5 pg (ref 26.6–33.0)
MCHC: 34.4 g/dL (ref 31.5–35.7)
MCV: 92 fL (ref 79–97)
PLATELET: 245 10*3/uL (ref 150–379)
RBC: 4.79 x10E6/uL (ref 4.14–5.80)
RDW: 13.9 % (ref 12.3–15.4)
WBC: 5.7 10*3/uL (ref 3.4–10.8)

## 2017-12-08 LAB — METABOLIC PANEL, COMPREHENSIVE
A-G Ratio: 1.7 (ref 1.2–2.2)
ALT (SGPT): 32 IU/L (ref 0–44)
AST (SGOT): 30 IU/L (ref 0–40)
Albumin: 4.2 g/dL (ref 3.5–5.5)
Alk. phosphatase: 81 IU/L (ref 39–117)
BUN/Creatinine ratio: 8 — ABNORMAL LOW (ref 9–20)
BUN: 9 mg/dL (ref 6–24)
Bilirubin, total: 0.4 mg/dL (ref 0.0–1.2)
CO2: 20 mmol/L (ref 20–29)
Calcium: 9.1 mg/dL (ref 8.7–10.2)
Chloride: 107 mmol/L — ABNORMAL HIGH (ref 96–106)
Creatinine: 1.06 mg/dL (ref 0.76–1.27)
GFR est AA: 100 mL/min/{1.73_m2} (ref >59)
GFR est non-AA: 86 mL/min/{1.73_m2} (ref >59)
GLOBULIN, TOTAL: 2.5 g/dL (ref 1.5–4.5)
Glucose: 102 mg/dL — ABNORMAL HIGH (ref 65–99)
Potassium: 3.9 mmol/L (ref 3.5–5.2)
Protein, total: 6.7 g/dL (ref 6.0–8.5)
Sodium: 147 mmol/L — ABNORMAL HIGH (ref 134–144)

## 2017-12-08 LAB — LIPID PANEL
Cholesterol, total: 226 mg/dL — ABNORMAL HIGH (ref 100–199)
HDL Cholesterol: 40 mg/dL (ref >39)
LDL, calculated: 160 mg/dL — ABNORMAL HIGH (ref 0–99)
Triglyceride: 128 mg/dL (ref 0–149)
VLDL, calculated: 26 mg/dL (ref 5–40)

## 2017-12-08 LAB — TSH AND FREE T4
T4, Free: 1.35 ng/dL (ref 0.82–1.77)
TSH: 0.472 u[IU]/mL (ref 0.450–4.500)

## 2017-12-08 LAB — CVD REPORT

## 2017-12-08 LAB — NT-PRO BNP: PROBNP: 336 pg/mL — ABNORMAL HIGH (ref 0–86)

## 2017-12-08 LAB — TROPONIN I: Troponin-I, Qt.: 0.02 ng/mL (ref 0.00–0.04)

## 2017-12-10 ENCOUNTER — Ambulatory Visit
Admit: 2017-12-10 | Discharge: 2017-12-10 | Payer: PRIVATE HEALTH INSURANCE | Attending: Nurse Practitioner | Primary: Nurse Practitioner

## 2017-12-10 DIAGNOSIS — I160 Hypertensive urgency: Secondary | ICD-10-CM

## 2017-12-10 MED ORDER — ATORVASTATIN 20 MG TAB
20 mg | ORAL_TABLET | Freq: Every day | ORAL | 3 refills | Status: DC
Start: 2017-12-10 — End: 2018-01-20

## 2017-12-10 MED ORDER — METOPROLOL TARTRATE 50 MG TAB
50 mg | ORAL_TABLET | Freq: Two times a day (BID) | ORAL | 3 refills | Status: DC
Start: 2017-12-10 — End: 2018-01-14

## 2017-12-10 NOTE — Patient Instructions (Signed)
High Blood Pressure: Care Instructions  Overview    It's normal for blood pressure to go up and down throughout the day. But if it stays up, you have high blood pressure. Another name for high blood pressure is hypertension.  Despite what a lot of people think, high blood pressure usually doesn't cause headaches or make you feel dizzy or lightheaded. It usually has no symptoms. But it does increase your risk of stroke, heart attack, and other problems. You and your doctor will talk about your risks of these problems based on your blood pressure.  Your doctor will give you a goal for your blood pressure. Your goal will be based on your health and your age.  Lifestyle changes, such as eating healthy and being active, are always important to help lower blood pressure. You might also take medicine to reach your blood pressure goal.  Follow-up care is a key part of your treatment and safety. Be sure to make and go to all appointments, and call your doctor if you are having problems. It's also a good idea to know your test results and keep a list of the medicines you take.  How can you care for yourself at home?  Medical treatment  ?? If you stop taking your medicine, your blood pressure will go back up. You may take one or more types of medicine to lower your blood pressure. Be safe with medicines. Take your medicine exactly as prescribed. Call your doctor if you think you are having a problem with your medicine.  ?? Talk to your doctor before you start taking aspirin every day. Aspirin can help certain people lower their risk of a heart attack or stroke. But taking aspirin isn't right for everyone, because it can cause serious bleeding.  ?? See your doctor regularly. You may need to see the doctor more often at first or until your blood pressure comes down.  ?? If you are taking blood pressure medicine, talk to your doctor before you take decongestants or anti-inflammatory medicine, such as ibuprofen.  Some of these medicines can raise blood pressure.  ?? Learn how to check your blood pressure at home.  Lifestyle changes  ?? Stay at a healthy weight. This is especially important if you put on weight around the waist. Losing even 10 pounds can help you lower your blood pressure.  ?? If your doctor recommends it, get more exercise. Walking is a good choice. Bit by bit, increase the amount you walk every day. Try for at least 30 minutes on most days of the week. You also may want to swim, bike, or do other activities.  ?? Avoid or limit alcohol. Talk to your doctor about whether you can drink any alcohol.  ?? Try to limit how much sodium you eat to less than 2,300 milligrams (mg) a day. Your doctor may ask you to try to eat less than 1,500 mg a day.  ?? Eat plenty of fruits (such as bananas and oranges), vegetables, legumes, whole grains, and low-fat dairy products.  ?? Lower the amount of saturated fat in your diet. Saturated fat is found in animal products such as milk, cheese, and meat. Limiting these foods may help you lose weight and also lower your risk for heart disease.  ?? Do not smoke. Smoking increases your risk for heart attack and stroke. If you need help quitting, talk to your doctor about stop-smoking programs and medicines. These can increase your chances of quitting for good.  When   should you call for help?  Call 911 anytime you think you may need emergency care. This may mean having symptoms that suggest that your blood pressure is causing a serious heart or blood vessel problem. Your blood pressure may be over 180/120.  ??For example, call 911 if:  ?? ?? You have symptoms of a heart attack. These may include:  ? Chest pain or pressure, or a strange feeling in the chest.  ? Sweating.  ? Shortness of breath.  ? Nausea or vomiting.  ? Pain, pressure, or a strange feeling in the back, neck, jaw, or upper belly or in one or both shoulders or arms.  ? Lightheadedness or sudden weakness.   ? A fast or irregular heartbeat.   ?? ?? You have symptoms of a stroke. These may include:  ? Sudden numbness, tingling, weakness, or loss of movement in your face, arm, or leg, especially on only one side of your body.  ? Sudden vision changes.  ? Sudden trouble speaking.  ? Sudden confusion or trouble understanding simple statements.  ? Sudden problems with walking or balance.  ? A sudden, severe headache that is different from past headaches.   ?? ?? You have severe back or belly pain.   ??Do not wait until your blood pressure comes down on its own. Get help right away.  ??Call your doctor now or seek immediate care if:  ?? ?? Your blood pressure is much higher than normal (such as 180/120 or higher), but you don't have symptoms.   ?? ?? You think high blood pressure is causing symptoms, such as:  ? Severe headache.  ? Blurry vision.   ??Watch closely for changes in your health, and be sure to contact your doctor if:  ?? ?? Your blood pressure measures higher than your doctor recommends at least 2 times. That means the top number is higher or the bottom number is higher, or both.   ?? ?? You think you may be having side effects from your blood pressure medicine.   Where can you learn more?  Go to http://www.healthwise.net/[employer].  Enter X567 in the search box to learn more about "High Blood Pressure: Care Instructions."  Current as of: May 03, 2017  Content Version: 11.9  ?? 2006-2018 [employer]. Care instructions adapted under license by [employer] (which disclaims liability or warranty for this information). If you have questions about a medical condition or this instruction, always ask your healthcare professional. Healthwise, Inc[employer] disclaims any warranty or liability for your use of this information.

## 2017-12-10 NOTE — Progress Notes (Signed)
Brandon Alvarado is a 42 y.o. African American male and presents with    Chief Complaint   Patient presents with   ??? Hypertension       Subjective:  Brandon Alvarado presents today for follow up of his hypertension. He was seen in the office on 2/25 as a new patient with complaints of shortness of breath and sputum production. Symptoms were present since December 2018. He was worked up in the ED on 2 occasions but left AMA stating he was not told what was going on. When seen on 2/25 blood pressure was elevated due to non compliance with medication regimen x > 1 year. He denied chest pain. Chest xray was ordered as well as restarting Losartan, Amlodipine, and HCTZ. He was also prescribed prednisone and albuterol for his breathing.   Today, he states he was unable to get any of his blood pressure medications- stating pharmacy did not have medication ready when he went to pick it up and he was unable to afford it despite having health insurance. He was able to pick up the steroids. He reports a drastic improvement in his breathing. He is now able to lay flat without any difficulty in breathing when he was unable to do so 3 days ago.   He denies chest pain and shortness breath. He does not have a blood pressure cuff at home.     Additional Concerns: No       Patient Active Problem List   Diagnosis Code   ??? Inguinal hernia of left side without obstruction or gangrene K40.90   ??? History of kidney stones Z87.442   ??? Essential hypertension I10   ??? Rectal bleeding K62.5   ??? Internal and external prolapsed hemorrhoids K64.8   ??? Acute pulmonary edema (HCC) J81.0   ??? Hypertensive emergency I16.1     Current Outpatient Medications   Medication Sig Dispense Refill   ??? predniSONE (STERAPRED) 5 mg dose pack See administration instruction per 17m dose pack 21 Tab 0   ??? albuterol (PROVENTIL HFA, VENTOLIN HFA, PROAIR HFA) 90 mcg/actuation inhaler Take 1 Puff by inhalation every four (4) hours as needed for  Wheezing or Shortness of Breath. 1 Inhaler 3   ??? losartan (COZAAR) 50 mg tablet Take 1 Tab by mouth two (2) times a day. 60 Tab 0   ??? amLODIPine (NORVASC) 10 mg tablet Take 1 Tab by mouth daily. 30 Tab 0   ??? hydroCHLOROthiazide (HYDRODIURIL) 25 mg tablet Take 1 Tab by mouth daily. 30 Tab 0   ??? atorvastatin (LIPITOR) 20 mg tablet Take 1 Tab by mouth daily. 30 Tab 3   ??? metoprolol tartrate (LOPRESSOR) 50 mg tablet Take 1 Tab by mouth two (2) times a day. 60 Tab 3     Allergies   Allergen Reactions   ??? Lisinopril Other (comments)     Sore throat     Past Medical History:   Diagnosis Date   ??? Chronic pain     back   ??? GERD (gastroesophageal reflux disease)    ??? Hypertension    ??? Kidney stone      Past Surgical History:   Procedure Laterality Date   ??? COLONOSCOPY N/A 06/27/2016    [facility]   ??? HX GI      hemorrhoidectomy   ??? HX HEMORRHOIDECTOMY  08/14/2016   ??? HX ORTHOPAEDIC      left ankle, hardware in place  History reviewed. No pertinent family history.  Social History     Tobacco Use   ??? Smoking status: Current Every Day Smoker     Packs/day: 1.00     Years: 28.00     Pack years: 28.00     Types: Cigarettes   ??? Smokeless tobacco: Current User   Substance Use Topics   ??? Alcohol use: Yes     Comment: ocassionally       ROS   History obtained from the patient  General ROS: negative for - chills or fever  Respiratory ROS: no cough, shortness of breath, or wheezing  Cardiovascular ROS: no chest pain or dyspnea on exertion    All other systems reviewed and are negative.    Objective:  Vitals:    12/10/17 0723 12/10/17 0726   BP: (!) 174/123 (!) 184/126   Pulse: (!) 109    Resp: 18    Temp: 95.9 ??F (35.5 ??C)    TempSrc: Oral    SpO2: 95%    Weight: 202 lb (91.6 kg)    Height: '5\' 5"'$  (1.651 m)    PainSc:   0 - No pain        PE  General appearance - alert, well appearing, and in no distress  Mental status - normal mood, behavior, speech, dress, motor activity, and  thought processes  Chest - clear to auscultation, no wheezes, rales or rhonchi, symmetric air entry  Heart - tachycardic and regular rhythm  Extremities - peripheral pulses normal, no pedal edema, no clubbing or cyanosis      LABS   Lab Results   Component Value Date/Time    WBC 5.7 12/07/2017 12:00 AM    HGB 15.1 12/07/2017 12:00 AM    HCT 43.9 12/07/2017 12:00 AM    PLATELET 245 12/07/2017 12:00 AM    MCV 92 12/07/2017 12:00 AM     Lab Results   Component Value Date/Time    Cholesterol, total 226 (H) 12/07/2017 12:00 AM    HDL Cholesterol 40 12/07/2017 12:00 AM    LDL, calculated 160 (H) 12/07/2017 12:00 AM    Triglyceride 128 12/07/2017 12:00 AM     Lab Results   Component Value Date/Time    TSH 0.472 12/07/2017 12:00 AM    T4, Free 1.35 12/07/2017 12:00 AM      Lab Results   Component Value Date/Time    Troponin-I, Qt. 0.02 12/07/2017 12:00 AM      Lab Results   Component Value Date/Time    Sodium 147 (H) 12/07/2017 12:00 AM    Potassium 3.9 12/07/2017 12:00 AM    Chloride 107 (H) 12/07/2017 12:00 AM    CO2 20 12/07/2017 12:00 AM    Anion gap 7 10/05/2017 04:00 AM    Glucose 102 (H) 12/07/2017 12:00 AM    BUN 9 12/07/2017 12:00 AM    Creatinine 1.06 12/07/2017 12:00 AM    BUN/Creatinine ratio 8 (L) 12/07/2017 12:00 AM    GFR est AA 100 12/07/2017 12:00 AM    GFR est non-AA 86 12/07/2017 12:00 AM    Calcium 9.1 12/07/2017 12:00 AM    Bilirubin, total 0.4 12/07/2017 12:00 AM    ALT (SGPT) 32 12/07/2017 12:00 AM    AST (SGOT) 30 12/07/2017 12:00 AM    Alk. phosphatase 81 12/07/2017 12:00 AM    Protein, total 6.7 12/07/2017 12:00 AM    Albumin 4.2 12/07/2017 12:00 AM    Globulin 3.3 10/05/2017 04:00 AM  A-G Ratio 1.7 12/07/2017 12:00 AM        TESTS  Chest Xray 12/07/17: IMPRESSION:  ??  Borderline enlarged cardiac silhouette and prominent interstitial markings  bilaterally. Mild interstitial edema suspected.      Assessment/Plan:    1. Hypertensive Urgency- asymptomatic; has not picked up medications as  recommended 3 days ago; discussed risk of heart attack, stroke, and death; strongly recommended to go to ED for further evaluation; patient states he will not go to [facility] ED and will go to ED in [place]    2. Acute Pulmonary Edema- improvement in breathing since starting steroids; continue current regimen; Albuterol PRN SOB and/or wheezing    3. Hypercholesterolemia- script for Lipitor sent to pharmacy     Lab review: labs are reviewed, up to date and stable. I note pro-BNP elevated      Today's Visit: Metoprolol and Lipitor    Health Maintenance:   Will address at next office visit    I have discussed the diagnosis with the patient and the intended plan as seen in the above orders.  The patient has received an after-visit summary and questions were answered concerning future plans.  I have discussed medication side effects and warnings with the patient as well. I have reviewed the plan of care with the patient, accepted their input and they are in agreement with the treatment goals.       Follow-up Disposition:  Return in about 1 week (around 12/17/2017) for follow up hypertension.  More than 1/2 of this 15 minute visit was spent in counseling and coordination of care, as described above.    Warner Mccreedy, FNP-C

## 2017-12-10 NOTE — Progress Notes (Signed)
Brandon Alvarado is a 41 y.o. male  Chief Complaint   Patient presents with   ??? Hypertension     1. Have you been to the ER, urgent care clinic since your last visit?  Hospitalized since your last visit?No    2. Have you seen or consulted any other health care providers outside of the [facility] since your last visit?  Include any pap smears or colon screening. No

## 2017-12-17 ENCOUNTER — Ambulatory Visit
Admit: 2017-12-17 | Discharge: 2017-12-17 | Payer: PRIVATE HEALTH INSURANCE | Attending: Nurse Practitioner | Primary: Nurse Practitioner

## 2017-12-17 DIAGNOSIS — I10 Essential (primary) hypertension: Secondary | ICD-10-CM

## 2017-12-17 NOTE — Progress Notes (Signed)
Brandon Alvarado is a 42 y.o. male  Chief Complaint   Patient presents with   ??? Hypertension       1. Have you been to the ER, urgent care clinic since your last visit?  Hospitalized since your last visit?No    2. Have you seen or consulted any other health care providers outside of the [facility] since your last visit?  Include any pap smears or colon screening. No

## 2017-12-17 NOTE — Progress Notes (Signed)
Brandon Alvarado is a 42 y.o. African American male and presents with    Chief Complaint   Patient presents with   ??? Hypertension       Subjective:  Brandon Alvarado presents today for follow up of his blood pressure. He reports some fatigue since restarting his blood pressure medication  Cardiovascular Review:  The patient has hypertension.  Diet and Lifestyle: generally follows a low sodium diet  Home BP Monitoring: is not measured at home.  Pertinent ROS: taking medications as instructed, no medication side effects noted, no TIA's, no chest pain on exertion, no dyspnea on exertion, no swelling of ankles.     He is interested in smoking cessation. He stopped in the past for several years. He admits smoking is related to stress.    Additional Concerns: No        Patient Active Problem List   Diagnosis Code   ??? Inguinal hernia of left side without obstruction or gangrene K40.90   ??? History of kidney stones Z87.442   ??? Essential hypertension I10   ??? Rectal bleeding K62.5   ??? Internal and external prolapsed hemorrhoids K64.8   ??? Acute pulmonary edema (HCC) J81.0   ??? Hypertensive emergency I16.1     Current Outpatient Medications   Medication Sig Dispense Refill   ??? atorvastatin (LIPITOR) 20 mg tablet Take 1 Tab by mouth daily. 30 Tab 3   ??? metoprolol tartrate (LOPRESSOR) 50 mg tablet Take 1 Tab by mouth two (2) times a day. 60 Tab 3   ??? albuterol (PROVENTIL HFA, VENTOLIN HFA, PROAIR HFA) 90 mcg/actuation inhaler Take 1 Puff by inhalation every four (4) hours as needed for Wheezing or Shortness of Breath. 1 Inhaler 3   ??? losartan (COZAAR) 50 mg tablet Take 1 Tab by mouth two (2) times a day. 60 Tab 0   ??? amLODIPine (NORVASC) 10 mg tablet Take 1 Tab by mouth daily. 30 Tab 0   ??? hydroCHLOROthiazide (HYDRODIURIL) 25 mg tablet Take 1 Tab by mouth daily. 30 Tab 0     Allergies   Allergen Reactions   ??? Lisinopril Other (comments)     Sore throat     Past Medical History:   Diagnosis Date   ??? Chronic pain     back    ??? GERD (gastroesophageal reflux disease)    ??? Hypertension    ??? Kidney stone      Past Surgical History:   Procedure Laterality Date   ??? COLONOSCOPY N/A 06/27/2016    COLONOSCOPY performed by Donaciano Eva, MD at [facility] ENDOSCOPY   ??? HX GI      hemorrhoidectomy   ??? HX HEMORRHOIDECTOMY  08/14/2016   ??? HX ORTHOPAEDIC      left ankle, hardware in place     No family history on file.  Social History     Tobacco Use   ??? Smoking status: Current Every Day Smoker     Packs/day: 1.00     Years: 28.00     Pack years: 28.00     Types: Cigarettes   ??? Smokeless tobacco: Current User   Substance Use Topics   ??? Alcohol use: Yes     Comment: ocassionally       ROS   History obtained from the patient  General ROS: positive for  - fatigue  Respiratory ROS: no cough, shortness of breath, or wheezing  Cardiovascular ROS: no chest pain or dyspnea on exertion    All other  systems reviewed and are negative.    Objective:  Vitals:    12/17/17 0749   BP: 117/81   Pulse: 89   Resp: 18   Temp: 97.6 ??F (36.4 ??C)   TempSrc: Oral   SpO2: 95%   Weight: 202 lb (91.6 kg)   Height: 5' 5"  (1.651 m)   PainSc:   0 - No pain       PE  General appearance - alert, well appearing, and in no distress  Mental status - normal mood, behavior, speech, dress, motor activity, and thought processes  Chest - clear to auscultation, no wheezes, rales or rhonchi, symmetric air entry  Heart - normal rate and regular rhythm    LABS  Lab Results   Component Value Date/Time    WBC 5.7 12/07/2017 12:00 AM    HGB 15.1 12/07/2017 12:00 AM    HCT 43.9 12/07/2017 12:00 AM    PLATELET 245 12/07/2017 12:00 AM    MCV 92 12/07/2017 12:00 AM     Lab Results   Component Value Date/Time    Cholesterol, total 226 (H) 12/07/2017 12:00 AM    HDL Cholesterol 40 12/07/2017 12:00 AM    LDL, calculated 160 (H) 12/07/2017 12:00 AM    Triglyceride 128 12/07/2017 12:00 AM     Lab Results   Component Value Date/Time    TSH 0.472 12/07/2017 12:00 AM    T4, Free 1.35 12/07/2017 12:00 AM       Lab Results   Component Value Date/Time    Sodium 147 (H) 12/07/2017 12:00 AM    Potassium 3.9 12/07/2017 12:00 AM    Chloride 107 (H) 12/07/2017 12:00 AM    CO2 20 12/07/2017 12:00 AM    Anion gap 7 10/05/2017 04:00 AM    Glucose 102 (H) 12/07/2017 12:00 AM    BUN 9 12/07/2017 12:00 AM    Creatinine 1.06 12/07/2017 12:00 AM    BUN/Creatinine ratio 8 (L) 12/07/2017 12:00 AM    GFR est AA 100 12/07/2017 12:00 AM    GFR est non-AA 86 12/07/2017 12:00 AM    Calcium 9.1 12/07/2017 12:00 AM    Bilirubin, total 0.4 12/07/2017 12:00 AM    ALT (SGPT) 32 12/07/2017 12:00 AM    AST (SGOT) 30 12/07/2017 12:00 AM    Alk. phosphatase 81 12/07/2017 12:00 AM    Protein, total 6.7 12/07/2017 12:00 AM    Albumin 4.2 12/07/2017 12:00 AM    Globulin 3.3 10/05/2017 04:00 AM    A-G Ratio 1.7 12/07/2017 12:00 AM        Assessment/Plan:    1. Hypertension- BP well controlled with current regimen; continue; return in 1 month for BP check and medication evaluation    2. Tobacco Abuse- The patient was counseled on the dangers of tobacco use, and was advised to quit.  Reviewed strategies to maximize success, including removing cigarettes and smoking materials from environment, support of family/friends and written materials. Total time spent in discussion: 3 minutes    Lab review: labs are reviewed, up to date and stable    Today's Visit: Education, counseling    Health Maintenance:   Influenza Vaccine- declined  Pneumococcal Vaccine- declined    I have discussed the diagnosis with the patient and the intended plan as seen in the above orders.  The patient has received an after-visit summary and questions were answered concerning future plans.  I have discussed medication side effects and warnings with the patient as well. I  have reviewed the plan of care with the patient, accepted their input and they are in agreement with the treatment goals.       Follow-up Disposition:  Return in about 1 month (around 01/17/2018) for follow up blood  pressure/medication eval.  More than 1/2 of this 15 minute visit was spent in counseling and coordination of care, as described above.    Warner Mccreedy, FNP-C

## 2017-12-17 NOTE — Patient Instructions (Signed)
High Blood Pressure: Care Instructions  Overview    It's normal for blood pressure to go up and down throughout the day. But if it stays up, you have high blood pressure. Another name for high blood pressure is hypertension.  Despite what a lot of people think, high blood pressure usually doesn't cause headaches or make you feel dizzy or lightheaded. It usually has no symptoms. But it does increase your risk of stroke, heart attack, and other problems. You and your doctor will talk about your risks of these problems based on your blood pressure.  Your doctor will give you a goal for your blood pressure. Your goal will be based on your health and your age.  Lifestyle changes, such as eating healthy and being active, are always important to help lower blood pressure. You might also take medicine to reach your blood pressure goal.  Follow-up care is a key part of your treatment and safety. Be sure to make and go to all appointments, and call your doctor if you are having problems. It's also a good idea to know your test results and keep a list of the medicines you take.  How can you care for yourself at home?  Medical treatment  ?? If you stop taking your medicine, your blood pressure will go back up. You may take one or more types of medicine to lower your blood pressure. Be safe with medicines. Take your medicine exactly as prescribed. Call your doctor if you think you are having a problem with your medicine.  ?? Talk to your doctor before you start taking aspirin every day. Aspirin can help certain people lower their risk of a heart attack or stroke. But taking aspirin isn't right for everyone, because it can cause serious bleeding.  ?? See your doctor regularly. You may need to see the doctor more often at first or until your blood pressure comes down.  ?? If you are taking blood pressure medicine, talk to your doctor before you take decongestants or anti-inflammatory medicine, such as ibuprofen.  Some of these medicines can raise blood pressure.  ?? Learn how to check your blood pressure at home.  Lifestyle changes  ?? Stay at a healthy weight. This is especially important if you put on weight around the waist. Losing even 10 pounds can help you lower your blood pressure.  ?? If your doctor recommends it, get more exercise. Walking is a good choice. Bit by bit, increase the amount you walk every day. Try for at least 30 minutes on most days of the week. You also may want to swim, bike, or do other activities.  ?? Avoid or limit alcohol. Talk to your doctor about whether you can drink any alcohol.  ?? Try to limit how much sodium you eat to less than 2,300 milligrams (mg) a day. Your doctor may ask you to try to eat less than 1,500 mg a day.  ?? Eat plenty of fruits (such as bananas and oranges), vegetables, legumes, whole grains, and low-fat dairy products.  ?? Lower the amount of saturated fat in your diet. Saturated fat is found in animal products such as milk, cheese, and meat. Limiting these foods may help you lose weight and also lower your risk for heart disease.  ?? Do not smoke. Smoking increases your risk for heart attack and stroke. If you need help quitting, talk to your doctor about stop-smoking programs and medicines. These can increase your chances of quitting for good.  When   should you call for help?  Call 911 anytime you think you may need emergency care. This may mean having symptoms that suggest that your blood pressure is causing a serious heart or blood vessel problem. Your blood pressure may be over 180/120.  ??For example, call 911 if:  ?? ?? You have symptoms of a heart attack. These may include:  ? Chest pain or pressure, or a strange feeling in the chest.  ? Sweating.  ? Shortness of breath.  ? Nausea or vomiting.  ? Pain, pressure, or a strange feeling in the back, neck, jaw, or upper belly or in one or both shoulders or arms.  ? Lightheadedness or sudden weakness.   ? A fast or irregular heartbeat.   ?? ?? You have symptoms of a stroke. These may include:  ? Sudden numbness, tingling, weakness, or loss of movement in your face, arm, or leg, especially on only one side of your body.  ? Sudden vision changes.  ? Sudden trouble speaking.  ? Sudden confusion or trouble understanding simple statements.  ? Sudden problems with walking or balance.  ? A sudden, severe headache that is different from past headaches.   ?? ?? You have severe back or belly pain.   ??Do not wait until your blood pressure comes down on its own. Get help right away.  ??Call your doctor now or seek immediate care if:  ?? ?? Your blood pressure is much higher than normal (such as 180/120 or higher), but you don't have symptoms.   ?? ?? You think high blood pressure is causing symptoms, such as:  ? Severe headache.  ? Blurry vision.   ??Watch closely for changes in your health, and be sure to contact your doctor if:  ?? ?? Your blood pressure measures higher than your doctor recommends at least 2 times. That means the top number is higher or the bottom number is higher, or both.   ?? ?? You think you may be having side effects from your blood pressure medicine.   Where can you learn more?  Go to http://www.healthwise.net/GoodHelpConnections.  Enter X567 in the search box to learn more about "High Blood Pressure: Care Instructions."  Current as of: May 03, 2017  Content Version: 11.9  ?? 2006-2018 [employer]. Care instructions adapted under license by [employer] (which disclaims liability or warranty for this information). If you have questions about a medical condition or this instruction, always ask your healthcare professional. [employer], [employer] disclaims any warranty or liability for your use of this information.         Learning About Benefits From Quitting Smoking  How does quitting smoking make you healthier?    If you're thinking about quitting smoking, you may have a few reasons to  be smoke-free. Your health may be one of them.  ?? When you quit smoking, you lower your risks for cancer, lung disease, heart attack, stroke, blood vessel disease, and blindness from macular degeneration.  ?? When you're smoke-free, you get sick less often, and you heal faster. You are less likely to get colds, flu, bronchitis, and pneumonia.  ?? As a nonsmoker, you may find that your mood is better and you are less stressed.  When and how will you feel healthier?  Quitting has real health benefits that start from day 1 of being smoke-free. And the longer you stay smoke-free, the healthier you get and the better you feel.  The first hours  ?? After just 20   minutes, your blood pressure and heart rate go down. That means there's less stress on your heart and blood vessels.  ?? Within 12 hours, the level of carbon monoxide in your blood drops back to normal. That makes room for more oxygen. With more oxygen in your body, you may notice that you have more energy than when you smoked.  After 2 weeks  ?? Your lungs start to work better.  ?? Your risk of heart attack starts to drop.  After 1 month  ?? When your lungs are clear, you cough less and breathe deeper, so it's easier to be active.  ?? Your sense of taste and smell return. That means you can enjoy food more than you have since you started smoking.  Over the years  ?? After 1 year, your risk of heart disease is half what it would be if you kept smoking.  ?? After 5 years, your risk of stroke starts to shrink. Within a few years after that, it's about the same as if you'd never smoked.  ?? After 10 years, your risk of dying from lung cancer is cut by about half. And your risk for many other types of cancer is lower too.  How would quitting help others in your life?  When you quit smoking, you improve the health of everyone who now breathes in your smoke.  ?? Their heart, lung, and cancer risks drop, much like yours.   ?? They are sick less. For babies and small children, living smoke-free means they're less likely to have ear infections, pneumonia, and bronchitis.  ?? If you're a woman who is or will be pregnant someday, quitting smoking means a healthier newborn.  ?? Children who are close to you are less likely to become adult smokers.  Where can you learn more?  Go to http://www.healthwise.net/GoodHelpConnections.  Enter O319 in the search box to learn more about "Learning About Benefits From Quitting Smoking."  Current as of: July 08, 2017  Content Version: 11.9  ?? 2006-2018 [employer]. Care instructions adapted under license by [employer] (which disclaims liability or warranty for this information). If you have questions about a medical condition or this instruction, always ask your healthcare professional. [employer] disclaims any warranty or liability for your use of this information.

## 2017-12-18 ENCOUNTER — Ambulatory Visit: Payer: BLUE CROSS/BLUE SHIELD | Primary: Nurse Practitioner

## 2017-12-18 ENCOUNTER — Encounter: Attending: Cardiovascular Disease | Primary: Nurse Practitioner

## 2017-12-28 ENCOUNTER — Ambulatory Visit: Attending: Pulmonary Disease | Primary: Nurse Practitioner

## 2017-12-28 ENCOUNTER — Encounter: Admit: 2017-12-28 | Discharge: 2017-12-29 | Payer: PRIVATE HEALTH INSURANCE | Primary: Nurse Practitioner

## 2017-12-28 ENCOUNTER — Ambulatory Visit: Payer: BLUE CROSS/BLUE SHIELD | Primary: Nurse Practitioner

## 2017-12-28 ENCOUNTER — Ambulatory Visit
Admit: 2017-12-28 | Discharge: 2017-12-28 | Payer: PRIVATE HEALTH INSURANCE | Attending: Pulmonary Disease | Primary: Nurse Practitioner

## 2017-12-28 DIAGNOSIS — R0602 Shortness of breath: Secondary | ICD-10-CM

## 2017-12-28 NOTE — Progress Notes (Signed)
[facility]  Pulmonary, Critical Care, and Sleep Medicine  ??    Pulmonary Office Progress Notes  ??     Subjective:      The patient presents for evaluation of shortness of breath    History  42 year old male that started smoking at age 42.  At one point he was smoking 2 packs of cigarettes a day.  He is now smoking 0.5 packs of cigarettes a day.  He works as a [employer].  There is no family history of pulmonary or cardiac disease.  Severe hypertension, however, is common.    In the past 3 months he has been seen in the emergency room twice for shortness of breath.  In both instances radiographs suggested pulmonary edema.  Echocardiogram on 10/04/17 showed no abnormality of LVSF.  There was mild concentric left ventricular hypertrophy.  The right ventricle was normal, but the right atrium was mildly dilated.  PASP was estimated at 43 mmHg.    The patient's shortness of breath responded to bronchodilators.  Nevertheless, this does not explain the changes on the radiographs suggesting pulmonary edema.  The patient has been out of his antihypertensives until the last month or so.  Now that he is taking his antihypertensives, shortness of breath, orthopnea, PND, wheezing and cough have all improved to resolved.  Diastolic blood pressures have been documented into the 120 range.    Review of systems   He denies fevers, night sweats or weight loss.  He has had no dizziness.  Chest pains have resolved.  He denies dysphasia or abdominal pain.  He has had no lower edema.  Review of systems was completed in its entirety and is otherwise normal    Past medical history  Hypertension  Childhood asthma  Gastroesophageal reflux disease  Tobacco abuse    Past surgical history  Hemorrhoidectomy  Left ankle repair    Medication allergies: Lisinopril    Current Outpatient Medications on File Prior to Visit   Medication Sig Dispense Refill   ??? atorvastatin (LIPITOR) 20 mg tablet Take 1 Tab by mouth daily. 30 Tab 3    ??? metoprolol tartrate (LOPRESSOR) 50 mg tablet Take 1 Tab by mouth two (2) times a day. 60 Tab 3   ??? albuterol (PROVENTIL HFA, VENTOLIN HFA, PROAIR HFA) 90 mcg/actuation inhaler Take 1 Puff by inhalation every four (4) hours as needed for Wheezing or Shortness of Breath. 1 Inhaler 3   ??? losartan (COZAAR) 50 mg tablet Take 1 Tab by mouth two (2) times a day. 60 Tab 0   ??? amLODIPine (NORVASC) 10 mg tablet Take 1 Tab by mouth daily. 30 Tab 0   ??? hydroCHLOROthiazide (HYDRODIURIL) 25 mg tablet Take 1 Tab by mouth daily. 30 Tab 0     No current facility-administered medications on file prior to visit.      Social history  Smoking and occupational history is as outlined above    Family history  Hypertension        Objective:     He is alert, oriented and in no illicit rest.  Affect is normal.  Blood pressure 130/90, pulse 76, temperature 98.6 ??F (37 ??C), temperature source Oral, resp. rate 20, height 5' 4.5" (1.638 m), weight 91.6 kg (202 lb), SpO2 96 %.    Sclera are anicteric.  Extraocular muscles are intact.  Gaze is conjugate.  Oral mucosa is moist  Neck is supple and there is no lymphadenopathy or jugular venous distention.Trachea is midline.  Lungs are clear to auscultation  Heart has a regular rate and rhythm with out murmur or gallop.  There is a soft S4.  Abdomen is soft and nondistended  No cyanosis, clubbing or edema.  Trace pitting peripheral edema.  No facial rash.      Assessment  Dyspnea on exertion secondary to pulmonary edema secondary to uncontrolled hypertension  Childhood asthma  Dyspnea on exertion while being treated for hypertension adequately relieved with albuterol  Tobacco abuse      Plan:  Encourage antihypertensive medication compliance  Encourage smoking cessation  As needed albuterol  As needed return to clinic

## 2017-12-28 NOTE — Progress Notes (Signed)
Chief Complaint   Patient presents with   ??? Shortness of Breath     patient states that he "caught pneumonia" and went to the ER and eventually was told he had "fluid around his heart and lungs." he notices a wheeze in the evening and a sometimes cough that have both recently improved.     1. Have you been to the ER, urgent care clinic since your last visit?  Hospitalized since your last visit?No    2. Have you seen or consulted any other health care providers outside of the [facility] since your last visit?  Include any pap smears or colon screening. No       He denies seasonal allergies, or post nasal drip, and notes a history of asthma as a child. He complains that he frequently has bronchitis. Denies lung cancer or any other cancer in his family history.

## 2017-12-30 ENCOUNTER — Encounter: Attending: Cardiovascular Disease | Primary: Nurse Practitioner

## 2018-01-06 ENCOUNTER — Encounter

## 2018-01-06 NOTE — Telephone Encounter (Signed)
Pt called in requesting refills. Please advise.

## 2018-01-07 MED ORDER — HYDROCHLOROTHIAZIDE 25 MG TAB
25 mg | ORAL_TABLET | Freq: Every day | ORAL | 2 refills | Status: DC
Start: 2018-01-07 — End: 2018-01-20

## 2018-01-07 MED ORDER — AMLODIPINE 10 MG TAB
10 mg | ORAL_TABLET | Freq: Every day | ORAL | 2 refills | Status: DC
Start: 2018-01-07 — End: 2018-01-20

## 2018-01-07 MED ORDER — LOSARTAN 50 MG TAB
50 mg | ORAL_TABLET | Freq: Two times a day (BID) | ORAL | 2 refills | Status: DC
Start: 2018-01-07 — End: 2018-01-20

## 2018-01-14 ENCOUNTER — Ambulatory Visit
Admit: 2018-01-14 | Discharge: 2018-01-14 | Payer: PRIVATE HEALTH INSURANCE | Attending: Cardiovascular Disease | Primary: Nurse Practitioner

## 2018-01-14 DIAGNOSIS — I701 Atherosclerosis of renal artery: Secondary | ICD-10-CM

## 2018-01-14 MED ORDER — CARVEDILOL 12.5 MG TAB
12.5 mg | ORAL_TABLET | Freq: Two times a day (BID) | ORAL | 6 refills | Status: DC
Start: 2018-01-14 — End: 2018-01-20

## 2018-01-14 NOTE — Progress Notes (Signed)
[facility]    Brandon Alvarado is a 42 year old male with a history of hypertension, gastroesophageal reflux disease    Brandon Alvarado is here today to establish care with me.  Brandon Alvarado denies any prior history of myocardial infarction or congestive heart failure.  He denies any atrial fibrillation.  He was asked to come see me for the management of hypertension and diastolic dysfunction.  Brandon Alvarado was in the hospital 3 months ago with shortness of breath in the setting of uncontrolled hypertension because of noncompliance with hypertensive medication.  He was found to have a pulmonary congestion and enlarged heart.  Brandon Alvarado tells me that for last 1 month he has been taking all his medications regularly.  He does not have any chest pain or chest tightness.  He denies any palpitation, presyncope or syncope.  He denies any PND or lower extremity swelling.  He admits that he was not taking his medication regularly in the past however for last 1 month he is taking it regularly.    Denies any nausea, vomiting, abdominal pain, fever, chills, sputum production. No hematuria or other bleeding complaints    Past Medical History:   Diagnosis Date   ??? Chronic pain     back   ??? GERD (gastroesophageal reflux disease)    ??? Hypertension    ??? Kidney stone          Past Surgical History:   Procedure Laterality Date   ??? COLONOSCOPY N/A 06/27/2016    COLONOSCOPY performed by Estil Daft, MD at [facility] ENDOSCOPY   ??? HX GI      hemorrhoidectomy   ??? HX HEMORRHOIDECTOMY  08/14/2016   ??? HX ORTHOPAEDIC      left ankle, hardware in place       Current Outpatient Medications   Medication Sig   ??? losartan (COZAAR) 50 mg tablet Take 1 Tab by mouth two (2) times a day.   ??? hydroCHLOROthiazide (HYDRODIURIL) 25 mg tablet Take 1 Tab by mouth daily.   ??? amLODIPine (NORVASC) 10 mg tablet Take 1 Tab by mouth daily.   ??? atorvastatin (LIPITOR) 20 mg tablet Take 1 Tab by mouth daily.    ??? metoprolol tartrate (LOPRESSOR) 50 mg tablet Take 1 Tab by mouth two (2) times a day.   ??? albuterol (PROVENTIL HFA, VENTOLIN HFA, PROAIR HFA) 90 mcg/actuation inhaler Take 1 Puff by inhalation every four (4) hours as needed for Wheezing or Shortness of Breath.     No current facility-administered medications for this visit.        Allergies and Sensitivities:  Allergies   Allergen Reactions   ??? Lisinopril Other (comments)     Sore throat       Family History:  No family history on file.    Social History:  Social History     Tobacco Use   ??? Smoking status: Current Every Day Smoker     Packs/day: 1.00     Years: 28.00     Pack years: 28.00     Types: Cigarettes     Start date: 12/28/1985   ??? Smokeless tobacco: Current User   Substance Use Topics   ??? Alcohol use: Yes     Comment: ocassionally   ??? Drug use: No     He  reports that he has been smoking cigarettes.  He started smoking about 32 years ago. He has a 28.00 pack-year smoking history. He uses smokeless tobacco.  He  reports that  he drinks alcohol.    Review of Systems:  Cardiac symptoms as noted above in HPI. All others negative.  Denies fatigue, malaise, skin rash, joint pain, blurring vision, photophobia, neck pain, hemoptysis, chronic cough, nausea, vomiting, hematuria, burning micturition, BRBPR, chronic headaches.    Physical Exam:  BP Readings from Last 3 Encounters:   01/14/18 (!) 144/99   12/28/17 130/90   12/17/17 117/81         Pulse Readings from Last 3 Encounters:   01/14/18 76   12/28/17 76   12/17/17 89          Wt Readings from Last 3 Encounters:   01/14/18 207 lb (93.9 kg)   12/28/17 202 lb (91.6 kg)   12/17/17 202 lb (91.6 kg)       Constitutional: Oriented to person, place, and time.   HENT: Head: Normocephalic and atraumatic. Eyes: Conjunctivae and extraocular motions are normal.   Neck: No JVD present. Carotid bruit is not appreciated.   Cardiovascular: Regular rhythm.   No murmur, gallop or rubs appreciated   Lung: Breath sounds normal. No respiratory distress. No ronchi or rales appreciated  Abdominal: No tenderness. No rebound and no guarding.   Musculoskeletal: There is no lower extremity edema. No cynosis  Lymphadenopathy:  No cervical or supraclavicular adenopathy appriciated.   Neurological: No gross motor deficit noted.  Skin: No visible skin rash noted.   No Ear discharge noted  Psychiatric: Normal mood and affect.   Good distal pulse    Review of Data  LABS:   Lab Results   Component Value Date/Time    Sodium 147 (H) 12/07/2017 12:00 AM    Potassium 3.9 12/07/2017 12:00 AM    Chloride 107 (H) 12/07/2017 12:00 AM    CO2 20 12/07/2017 12:00 AM    Glucose 102 (H) 12/07/2017 12:00 AM    BUN 9 12/07/2017 12:00 AM    Creatinine 1.06 12/07/2017 12:00 AM     Lipids Latest Ref Rng & Units 12/07/2017 08/27/2016   Chol, Total 100 - 199 mg/dL 161(W) 960(A)   HDL >54 mg/dL 40 09(W)   LDL 0 - 99 mg/dL 119(J) 478(G)   Trig 0 - 149 mg/dL 956 213(Y)     Lab Results   Component Value Date/Time    ALT (SGPT) 32 12/07/2017 12:00 AM     Lab Results   Component Value Date/Time    Hemoglobin A1c (POC) 5.8 03/27/2016 10:58 AM       EKG    ECHO (12/18)  ft Ventricle Normal cavity size and systolic function (ejection fraction normal). Mild concentric hypertrophy observed. The estimated ejection fraction is 56 - 60%. Unable to assess diastolic function.   Left Atrium The cavity size is mildly dilated.   Right Ventricle Normal cavity size and global systolic function.   Right Atrium The cavity size is mildly dilated.   Aortic Valve Normal aortic valve structure. No stenosis and no regurgitation.   Mitral Valve Normal valve structure and no stenosis. Trace regurgitation.   Tricuspid Valve Normal valve structure and no stenosis. Mild tricuspid valve regurgitation. Pulmonary arterial systolic pressure is 43 mmHg. Mild pulmonary hypertension.     STRESS TEST (EST, PHARM, NUC, ECHO etc)    CATHETERIZATION    IMPRESSION & PLAN:   Brandon Alvarado is 42 year old male with a history of hypertension, GERD    Hypertension:  Blood pressure today is 144/98 mmHg.  Currently he is on losartan, hydrochlorthiazide, amlodipine and metoprolol.  I am  going to stop metoprolol and start him on nonselective Coreg 12.5 mg twice daily.  I will increase this dose as much as possible.  I will also order a renal artery Doppler to make sure he does not have any renal artery stenosis.  He did have a echocardiogram done in December 2018 with normal ejection fraction.  Salt restriction and importance of compliance with the medication was discussed with the patient today.    Patient likely had diastolic congestive heart failure in setting of noncompliance with her antihypertensive medication in December 2018.  Since then he has been taking medications regularly.  I agreed to continue with beta-blocker, amlodipine, hydrochlorthiazide and losartan at this time.    He does not have any anginal symptoms or no evidence of fluid overload on exam today.    Importance of diet and exercise was discussed with patient.  This plan was discussed with patient who is in agreement.    Thank you for allowing me to participate in patient care. Please feel free to call me if you have any question or concern. Earl LitesSaumil Miyoko Hashimi, MD  Please note: This document has been produced using voice recognition software. Unrecognized errors in transcription may be present.

## 2018-01-14 NOTE — Progress Notes (Signed)
1. Have you been to the ER, urgent care clinic since your last visit?  Hospitalized since your last visit? No    2. Have you seen or consulted any other health care providers outside of the [facility] since your last visit?  Include any pap smears or colon screening.  No

## 2018-01-14 NOTE — Patient Instructions (Signed)
Brandon NixonJanice or Brandon [name] will call to schedule your testing within 24-48 hours. If you do not hear from her, then please call her directly at [phone] Brandon Alvarado(Brandon Alvarado) or [phone] Brandon Alvarado(Brandon Alvarado)    All testing/lab work [facility] in [address]   [facility]     Stop Metoprolol   Start Coreg 12.5mg  twice per day

## 2018-01-20 ENCOUNTER — Ambulatory Visit
Admit: 2018-01-20 | Discharge: 2018-01-20 | Payer: PRIVATE HEALTH INSURANCE | Attending: Nurse Practitioner | Primary: Nurse Practitioner

## 2018-01-20 DIAGNOSIS — I10 Essential (primary) hypertension: Secondary | ICD-10-CM

## 2018-01-20 MED ORDER — HYDROCHLOROTHIAZIDE 25 MG TAB
25 mg | ORAL_TABLET | Freq: Every day | ORAL | 1 refills | Status: AC
Start: 2018-01-20 — End: ?

## 2018-01-20 MED ORDER — CARVEDILOL 12.5 MG TAB
12.5 mg | ORAL_TABLET | Freq: Two times a day (BID) | ORAL | 1 refills | Status: AC
Start: 2018-01-20 — End: ?

## 2018-01-20 MED ORDER — ATORVASTATIN 20 MG TAB
20 mg | ORAL_TABLET | Freq: Every day | ORAL | 1 refills | Status: AC
Start: 2018-01-20 — End: ?

## 2018-01-20 MED ORDER — LOSARTAN 50 MG TAB
50 mg | ORAL_TABLET | Freq: Two times a day (BID) | ORAL | 1 refills | Status: AC
Start: 2018-01-20 — End: ?

## 2018-01-20 MED ORDER — AMLODIPINE 10 MG TAB
10 mg | ORAL_TABLET | Freq: Every day | ORAL | 1 refills | Status: AC
Start: 2018-01-20 — End: ?

## 2018-01-20 NOTE — Progress Notes (Signed)
Brandon Alvarado is a 42 y.o. male  Chief Complaint   Patient presents with   ??? Hypertension     1. Have you been to the ER, urgent care clinic since your last visit?  Hospitalized since your last visit?No    2. Have you seen or consulted any other health care providers outside of the [facility] since your last visit?  Include any pap smears or colon screening. No

## 2018-01-20 NOTE — Progress Notes (Signed)
Brandon Alvarado is a 42 y.o. African American male and presents with    Chief Complaint   Patient presents with   ??? Hypertension       Subjective:  Brandon Alvarado presents today for follow up of his blood pressure. He saw Dr. Allena KatzPatel on 4/4 and metoprolol was discontinued and carvedilol was added. He has not picked up this new prescription yet and he believes he ran out of one of his medications but is unsure of which one.     He continues to smoke and is smoking 1/2 PPD. He admits to smoking a cigarette prior to his office visit.     Cardiovascular Review:  The patient has hypertension and Diastolic HF.  Diet and Lifestyle: not attempting to follow a low sodium diet, sedentary, smoker 1/2 PPD  Home BP Monitoring: is not measured at home.  Pertinent ROS: taking medications as instructed, no medication side effects noted, no TIA's, no chest pain on exertion, no dyspnea on exertion, no swelling of ankles.     Additional Concerns: No         Patient Active Problem List   Diagnosis Code   ??? Inguinal hernia of left side without obstruction or gangrene K40.90   ??? History of kidney stones Z87.442   ??? Essential hypertension I10   ??? Rectal bleeding K62.5   ??? Internal and external prolapsed hemorrhoids K64.8   ??? Acute pulmonary edema (HCC) J81.0   ??? Hypertensive emergency I16.1     Current Outpatient Medications   Medication Sig Dispense Refill   ??? losartan (COZAAR) 50 mg tablet Take 1 Tab by mouth two (2) times a day. 60 Tab 2   ??? hydroCHLOROthiazide (HYDRODIURIL) 25 mg tablet Take 1 Tab by mouth daily. 30 Tab 2   ??? amLODIPine (NORVASC) 10 mg tablet Take 1 Tab by mouth daily. 30 Tab 2   ??? atorvastatin (LIPITOR) 20 mg tablet Take 1 Tab by mouth daily. 30 Tab 3   ??? albuterol (PROVENTIL HFA, VENTOLIN HFA, PROAIR HFA) 90 mcg/actuation inhaler Take 1 Puff by inhalation every four (4) hours as needed for Wheezing or Shortness of Breath. 1 Inhaler 3   ??? carvedilol (COREG) 12.5 mg tablet Take 1 Tab by mouth two (2) times  daily (with meals). 60 Tab 6     Allergies   Allergen Reactions   ??? Lisinopril Other (comments)     Sore throat     Past Medical History:   Diagnosis Date   ??? Chronic pain     back   ??? GERD (gastroesophageal reflux disease)    ??? Hypertension    ??? Kidney stone    ??? Tobacco abuse      Past Surgical History:   Procedure Laterality Date   ??? COLONOSCOPY N/A 06/27/2016    [facility] D[facility]ong S [facility]   ??? HX GI      hemorrhoidectomy   ??? HX HEMORRHOIDECTOMY  08/14/2016   ??? HX ORTHOPAEDIC      left ankle, hardware in place     History reviewed. No pertinent family history.  Social History     Tobacco Use   ??? Smoking status: Current Every Day Smoker     Packs/day: 1.00     Years: 28.00     Pack years: 28.00     Types: Cigarettes     Start date: 12/28/1985   ??? Smokeless tobacco: Current User   Substance Use Topics   ???  Alcohol use: Yes     Comment: ocassionally       ROS   History obtained from the patient  General ROS: negative for - chills or fever  Respiratory ROS: no cough, shortness of breath, or wheezing  Cardiovascular ROS: no chest pain or dyspnea on exertion    All other systems reviewed and are negative.    Objective:  Vitals:    01/20/18 0814 01/20/18 0817 01/20/18 0843   BP: (!) 170/115 (!) 170/118 (!) 168/110   Pulse: 78     Resp: 18     Temp: 96.9 ??F (36.1 ??C)     TempSrc: Oral     SpO2: 97%     Weight: 207 lb (93.9 kg)     Height: 5' 4.5" (1.638 m)     PainSc:   0 - No pain         PE  General appearance - alert, well appearing, and in no distress  Mental status - normal mood, behavior, speech, dress, motor activity, and thought processes  Chest - clear to auscultation, no wheezes, rales or rhonchi, symmetric air entry  Heart - normal rate and regular rhythm  Extremities - peripheral pulses normal, no pedal edema, no clubbing or cyanosis      Assessment/Plan:    1. Hypertension- uncontrolled; has not taken coreg and ran out of one  medication (unsure of which one); will prescribe 90 day supply of meds to promote adherence; low sodium diet; if experiencing CP, SOB, or DOE go to ED    2. Tobacco abuse- The patient was counseled on the dangers of tobacco use, and was advised to quit.  Reviewed strategies to maximize success, including written materials. Total time spent in discussion: 3 minutes      Lab review: no lab studies available for review at time of visit      Today's Visit:   Orders Placed This Encounter   ??? losartan (COZAAR) 50 mg tablet     Sig: Take 1 Tab by mouth two (2) times a day.     Dispense:  180 Tab     Refill:  1     Please consider 90 day supplies to promote better adherence   ??? hydroCHLOROthiazide (HYDRODIURIL) 25 mg tablet     Sig: Take 1 Tab by mouth daily.     Dispense:  90 Tab     Refill:  1   ??? amLODIPine (NORVASC) 10 mg tablet     Sig: Take 1 Tab by mouth daily.     Dispense:  90 Tab     Refill:  1   ??? atorvastatin (LIPITOR) 20 mg tablet     Sig: Take 1 Tab by mouth daily.     Dispense:  90 Tab     Refill:  1   ??? carvedilol (COREG) 12.5 mg tablet     Sig: Take 1 Tab by mouth two (2) times daily (with meals).     Dispense:  180 Tab     Refill:  1         Health Maintenance:   [facility]  Pneumococcal Vaccine- declined    I have discussed the diagnosis with the patient and the intended plan as seen in the above orders.  The patient has received an after-visit summary and questions were answered concerning future plans.  I have discussed medication side effects and warnings with the patient as well. I have  reviewed the plan of care with the patient, accepted their input and they are in agreement with the treatment goals.       Follow-up and Dispositions    ?? Return in about 3 weeks (around 02/10/2018) for BP check.       More than 1/2 of this 15 minute visit was spent in counseling and coordination of care, as described above.    Aura Camps, FNP-C

## 2018-01-20 NOTE — Patient Instructions (Signed)
DASH Diet: Care Instructions  Your Care Instructions    The DASH diet is an eating plan that can help lower your blood pressure. DASH stands for Dietary Approaches to Stop Hypertension. Hypertension is high blood pressure.  The DASH diet focuses on eating foods that are high in calcium, potassium, and magnesium. These nutrients can lower blood pressure. The foods that are highest in these nutrients are fruits, vegetables, low-fat dairy products, nuts, seeds, and legumes. But taking calcium, potassium, and magnesium supplements instead of eating foods that are high in those nutrients does not have the same effect. The DASH diet also includes whole grains, fish, and poultry.  The DASH diet is one of several lifestyle changes your doctor may recommend to lower your high blood pressure. Your doctor may also want you to decrease the amount of sodium in your diet. Lowering sodium while following the DASH diet can lower blood pressure even further than just the DASH diet alone.  Follow-up care is a key part of your treatment and safety. Be sure to make and go to all appointments, and call your doctor if you are having problems. It's also a good idea to know your test results and keep a list of the medicines you take.  How can you care for yourself at home?  Following the DASH diet  ?? Eat 4 to 5 servings of fruit each day. A serving is 1 medium-sized piece of fruit, ?? cup chopped or canned fruit, 1/4 cup dried fruit, or 4 ounces (?? cup) of fruit juice. Choose fruit more often than fruit juice.  ?? Eat 4 to 5 servings of vegetables each day. A serving is 1 cup of lettuce or raw leafy vegetables, ?? cup of chopped or cooked vegetables, or 4 ounces (?? cup) of vegetable juice. Choose vegetables more often than vegetable juice.  ?? Get 2 to 3 servings of low-fat and fat-free dairy each day. A serving is 8 ounces of milk, 1 cup of yogurt, or 1 ?? ounces of cheese.   ?? Eat 6 to 8 servings of grains each day. A serving is 1 slice of bread, 1 ounce of dry cereal, or ?? cup of cooked rice, pasta, or cooked cereal. Try to choose whole-grain products as much as possible.  ?? Limit lean meat, poultry, and fish to 2 servings each day. A serving is 3 ounces, about the size of a deck of cards.  ?? Eat 4 to 5 servings of nuts, seeds, and legumes (cooked dried beans, lentils, and split peas) each week. A serving is 1/3 cup of nuts, 2 tablespoons of seeds, or ?? cup of cooked beans or peas.  ?? Limit fats and oils to 2 to 3 servings each day. A serving is 1 teaspoon of vegetable oil or 2 tablespoons of salad dressing.  ?? Limit sweets and added sugars to 5 servings or less a week. A serving is 1 tablespoon jelly or jam, ?? cup sorbet, or 1 cup of lemonade.  ?? Eat less than 2,300 milligrams (mg) of sodium a day. If you limit your sodium to 1,500 mg a day, you can lower your blood pressure even more.  Tips for success  ?? Start small. Do not try to make dramatic changes to your diet all at once. You might feel that you are missing out on your favorite foods and then be more likely to not follow the plan. Make small changes, and stick with them. Once those changes become habit, add a   few more changes.  ?? Try some of the following:  ? Make it a goal to eat a fruit or vegetable at every meal and at snacks. This will make it easy to get the recommended amount of fruits and vegetables each day.  ? Try yogurt topped with fruit and nuts for a snack or healthy dessert.  ? Add lettuce, tomato, cucumber, and onion to sandwiches.  ? Combine a ready-made pizza crust with low-fat mozzarella cheese and lots of vegetable toppings. Try using tomatoes, squash, spinach, broccoli, carrots, cauliflower, and onions.  ? Have a variety of cut-up vegetables with a low-fat dip as an appetizer instead of chips and dip.  ? Sprinkle sunflower seeds or chopped almonds over salads. Or try adding  chopped walnuts or almonds to cooked vegetables.  ? Try some vegetarian meals using beans and peas. Add garbanzo or kidney beans to salads. Make burritos and tacos with mashed pinto beans or black beans.  Where can you learn more?  Go to http://www.healthwise.net/[employer].  Enter H967 in the search box to learn more about "DASH Diet: Care Instructions."  Current as of: May 03, 2017  Content Version: 11.9  ?? 2006-2018 [employer]. Care instructions adapted under license by [employer] (which disclaims liability or warranty for this information). If you have questions about a medical condition or this instruction, always ask your healthcare professional. [employer] disclaims any warranty or liability for your use of this information.

## 2018-02-25 ENCOUNTER — Encounter: Attending: Cardiovascular Disease | Primary: Nurse Practitioner

## 2018-04-17 ENCOUNTER — Emergency Department

## 2018-04-17 ENCOUNTER — Other Ambulatory Visit: Payer: Self-pay

## 2018-04-17 ENCOUNTER — Emergency Department
Admission: EM | Admit: 2018-04-17 | Discharge: 2018-04-17 | Attending: Emergency Medicine | Admitting: Emergency Medicine

## 2018-04-17 DIAGNOSIS — T148XXA Other injury of unspecified body region, initial encounter: Secondary | ICD-10-CM

## 2018-04-17 DIAGNOSIS — F17200 Nicotine dependence, unspecified, uncomplicated: Secondary | ICD-10-CM | POA: Insufficient documentation

## 2018-04-17 DIAGNOSIS — Y9389 Activity, other specified: Secondary | ICD-10-CM | POA: Insufficient documentation

## 2018-04-17 DIAGNOSIS — F1010 Alcohol abuse, uncomplicated: Secondary | ICD-10-CM | POA: Insufficient documentation

## 2018-04-17 DIAGNOSIS — Y92410 Unspecified street and highway as the place of occurrence of the external cause: Secondary | ICD-10-CM | POA: Insufficient documentation

## 2018-04-17 DIAGNOSIS — Y998 Other external cause status: Secondary | ICD-10-CM | POA: Insufficient documentation

## 2018-04-17 DIAGNOSIS — S7011XA Contusion of right thigh, initial encounter: Secondary | ICD-10-CM | POA: Insufficient documentation

## 2018-04-17 DIAGNOSIS — M79604 Pain in right leg: Secondary | ICD-10-CM | POA: Insufficient documentation

## 2018-04-17 HISTORY — DX: Essential (primary) hypertension: I10

## 2018-04-17 LAB — CBC
HCT: 37.3 % — ABNORMAL LOW (ref 40.0–52.0)
Hemoglobin: 12.9 g/dL — ABNORMAL LOW (ref 13.0–18.0)
MCH: 31.4 pg (ref 26.0–34.0)
MCHC: 34.5 g/dL (ref 32.0–36.0)
MCV: 90.8 fL (ref 80.0–100.0)
PLATELETS: 215 10*3/uL (ref 150–440)
RBC: 4.11 MIL/uL — ABNORMAL LOW (ref 4.40–5.90)
RDW: 14.5 % (ref 11.5–14.5)
WBC: 11.5 10*3/uL — AB (ref 3.8–10.6)

## 2018-04-17 LAB — ETHANOL: ALCOHOL ETHYL (B): 95 mg/dL — AB (ref <10)

## 2018-04-17 LAB — COMPREHENSIVE METABOLIC PANEL
ALT: 23 U/L (ref 0–44)
AST: 52 U/L — AB (ref 15–41)
Albumin: 4.1 g/dL (ref 3.5–5.0)
Alkaline Phosphatase: 51 U/L (ref 38–126)
Anion gap: 11 (ref 5–15)
BUN: 17 mg/dL (ref 6–20)
CALCIUM: 8.6 mg/dL — AB (ref 8.9–10.3)
CHLORIDE: 110 mmol/L (ref 98–111)
CO2: 22 mmol/L (ref 22–32)
CREATININE: 1.12 mg/dL (ref 0.61–1.24)
GFR calc Af Amer: >60 mL/min (ref >60)
Glucose, Bld: 110 mg/dL — ABNORMAL HIGH (ref 70–99)
Potassium: 2.8 mmol/L — ABNORMAL LOW (ref 3.5–5.1)
SODIUM: 143 mmol/L (ref 135–145)
Total Bilirubin: 0.7 mg/dL (ref 0.3–1.2)
Total Protein: 7.4 g/dL (ref 6.5–8.1)

## 2018-04-17 MED ORDER — IOHEXOL 300 MG/ML  SOLN
125.0000 mL | Freq: Once | INTRAMUSCULAR | Status: AC | PRN
  Administered 2018-04-17: 120 mL via INTRAVENOUS
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
## 2018-04-17 NOTE — ED Notes (Signed)
Patient transported to X-ray and CT;  Dr Lenard LancePaduchowski consulted for scan without labs; CT notified
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
